# Patient Record
Sex: Female | Born: 1950
Health system: Southern US, Community
[De-identification: ages and names within clinical notes are randomized; demographics above are authoritative.]

## PROBLEM LIST (undated history)

## (undated) DIAGNOSIS — E079 Disorder of thyroid, unspecified: Secondary | ICD-10-CM

## (undated) HISTORY — DX: Disorder of thyroid, unspecified: E07.9

---

## 2016-01-13 ENCOUNTER — Ambulatory Visit (INDEPENDENT_AMBULATORY_CARE_PROVIDER_SITE_OTHER): Payer: PPO | Admitting: Family Medicine

## 2016-01-13 ENCOUNTER — Encounter: Payer: Self-pay | Admitting: Family Medicine

## 2016-01-13 VITALS — BP 151/80 | HR 88 | Temp 98.2°F | Ht 60.75 in | Wt 145.2 lb

## 2016-01-13 DIAGNOSIS — IMO0001 Reserved for inherently not codable concepts without codable children: Secondary | ICD-10-CM

## 2016-01-13 DIAGNOSIS — R030 Elevated blood-pressure reading, without diagnosis of hypertension: Secondary | ICD-10-CM | POA: Diagnosis not present

## 2016-01-13 DIAGNOSIS — Z1159 Encounter for screening for other viral diseases: Secondary | ICD-10-CM | POA: Diagnosis not present

## 2016-01-13 DIAGNOSIS — Z1211 Encounter for screening for malignant neoplasm of colon: Secondary | ICD-10-CM | POA: Diagnosis not present

## 2016-01-13 DIAGNOSIS — E663 Overweight: Secondary | ICD-10-CM | POA: Diagnosis not present

## 2016-01-13 DIAGNOSIS — Z131 Encounter for screening for diabetes mellitus: Secondary | ICD-10-CM

## 2016-01-13 DIAGNOSIS — E049 Nontoxic goiter, unspecified: Secondary | ICD-10-CM | POA: Diagnosis not present

## 2016-01-13 DIAGNOSIS — Z114 Encounter for screening for human immunodeficiency virus [HIV]: Secondary | ICD-10-CM

## 2016-01-13 DIAGNOSIS — R7309 Other abnormal glucose: Secondary | ICD-10-CM

## 2016-01-13 NOTE — Progress Notes (Signed)
BP 151/80 mmHg  Pulse 88  Temp(Src) 98.2 F (36.8 C) (Oral)  Ht 5' 0.75" (1.543 m)  Wt 145 lb 3.2 oz (65.862 kg)  BMI 27.66 kg/m2   Subjective:    Patient ID: Margaret Stevens, female    DOB: 10-01-1950, 65 y.o.   MRN: [id number]  HPI: Margaret Stevens is a 65 y.o. female presenting on 01/13/2016 for New Patient (Initial Visit)   HPI Elevated blood pressure Patient is coming in to establish with [place] as a new patient today. She also wanted to get screening as it has been quite a few years since she's had any labs or checkups. Her blood pressure today is 151/80. Patient says this is higher than it usually is and that she gets white coat syndrome and that it usually runs in the 130s over 70s at home. Patient denies headaches, blurred vision, chest pains, shortness of breath, or weakness.   Enlarged thyroid  Patient was found to have an enlarged thyroid on exam. She did not know that she had this before and has not had a thyroid workup that she knows of. She denies any palpitations or constipation diarrhea or chest pains or hair or nail changes. She does have fatigue.  Relevant past medical, surgical, family and social history reviewed and updated as indicated. Interim medical history since our last visit reviewed. Allergies and medications reviewed and updated.  Review of Systems  Constitutional: Positive for fatigue. Negative for fever and chills.  HENT: Negative for congestion, ear discharge and ear pain.   Eyes: Negative for redness and visual disturbance.  Respiratory: Negative for chest tightness and shortness of breath.   Cardiovascular: Negative for chest pain and leg swelling.  Genitourinary: Negative for dysuria and difficulty urinating.  Musculoskeletal: Negative for back pain and gait problem.  Skin: Negative for rash.  Neurological: Negative for dizziness, light-headedness and headaches.  Psychiatric/Behavioral: Negative for behavioral problems and agitation.  All other  systems reviewed and are negative.   Per HPI unless specifically indicated above  Social History   Social History  . Marital Status: Married    Spouse Name: N/A  . Number of Children: N/A  . Years of Education: N/A   Occupational History  . Not on file.   Social History Main Topics  . Smoking status: Never Smoker   . Smokeless tobacco: Never Used  . Alcohol Use: No  . Drug Use: No  . Sexual Activity: Yes    Birth Control/ Protection: None     Comment: married 43 years   Other Topics Concern  . Not on file   Social History Narrative  . No narrative on file    History reviewed. No pertinent past surgical history.  Family History  Problem Relation Age of Onset  . Hypertension Mother   . Heart disease Father   . Arthritis Father   . COPD Father   . Learning disabilities Father   . Hypertension Father   . Diabetes Brother   . Learning disabilities Brother   . Cancer Maternal Grandmother     skin  . Depression Maternal Grandmother   . Stroke Maternal Grandmother   . Mental illness Maternal Grandmother   . Hypertension Maternal Grandmother   . Hypertension Maternal Grandfather   . Heart disease Paternal Grandmother   . Heart disease Paternal Grandfather       Medication List       This list is accurate as of: 01/13/16  9:47 AM.  Always use your  most recent med list.               ALLERGY RELIEF 10 MG tablet  Generic drug:  loratadine  Take 10 mg by mouth daily.     HAWTHORN BERRIES PO  Take by mouth.     KRILL OIL PLUS PO  Take by mouth.     PROBIOTIC DAILY PO  Take by mouth daily.     Vitamin D3 2000 units Tabs  Take by mouth.     WATER PILLS PO  Take by mouth.           Objective:    BP 151/80 mmHg  Pulse 88  Temp(Src) 98.2 F (36.8 C) (Oral)  Ht 5' 0.75" (1.543 m)  Wt 145 lb 3.2 oz (65.862 kg)  BMI 27.66 kg/m2  Wt Readings from Last 3 Encounters:  01/13/16 145 lb 3.2 oz (65.862 kg)    Physical Exam  Constitutional: She is  oriented to person, place, and time. She appears well-developed and well-nourished. No distress.  Eyes: Conjunctivae and EOM are normal. Pupils are equal, round, and reactive to light.  Neck: Neck supple. Thyromegaly (Diffuse mild thyromegaly) present.  Cardiovascular: Normal rate, regular rhythm, normal heart sounds and intact distal pulses.   No murmur heard. Pulmonary/Chest: Effort normal and breath sounds normal. No respiratory distress. She has no wheezes.  Musculoskeletal: Normal range of motion. She exhibits no edema or tenderness.  Lymphadenopathy:    She has no cervical adenopathy.  Neurological: She is alert and oriented to person, place, and time. Coordination normal.  Skin: Skin is warm and dry. No rash noted. She is not diaphoretic.  Psychiatric: She has a normal mood and affect. Her behavior is normal.  Nursing note and vitals reviewed.     Assessment & Plan:   Problem List Items Addressed This Visit    None    Visit Diagnoses    Overweight (BMI 25.0-29.9)    -  Primary    Relevant Orders    CMP14+EGFR (Completed)    Lipid panel (Completed)    Diabetes mellitus screening        Relevant Orders    CMP14+EGFR (Completed)    Need for hepatitis C screening test        Relevant Orders    Hepatitis C antibody (Completed)    Screening for HIV without presence of risk factors        Relevant Orders    HIV antibody (Completed)    Enlarged thyroid gland        Relevant Orders    TSH (Completed)    Special screening for malignant neoplasms, colon        Relevant Orders    Cologuard    Elevated BP        Other abnormal glucose            Follow up plan: Return in about 4 weeks (around 02/10/2016), or if symptoms worsen or fail to improve, for Hypertension recheck.  Caryl Pina, MD [facility] 01/13/2016, 9:47 AM

## 2016-01-14 LAB — LIPID PANEL
CHOL/HDL RATIO: 4.3 ratio (ref 0.0–4.4)
Cholesterol, Total: 200 mg/dL — ABNORMAL HIGH (ref 100–199)
HDL: 46 mg/dL (ref >39)
LDL Calculated: 115 mg/dL — ABNORMAL HIGH (ref 0–99)
TRIGLYCERIDES: 195 mg/dL — AB (ref 0–149)
VLDL Cholesterol Cal: 39 mg/dL (ref 5–40)

## 2016-01-14 LAB — CMP14+EGFR
A/G RATIO: 1.4 (ref 1.2–2.2)
ALK PHOS: 84 IU/L (ref 39–117)
ALT: 20 IU/L (ref 0–32)
AST: 20 IU/L (ref 0–40)
Albumin: 4.6 g/dL (ref 3.6–4.8)
BUN/Creatinine Ratio: 16 (ref 12–28)
BUN: 13 mg/dL (ref 8–27)
Bilirubin Total: 0.4 mg/dL (ref 0.0–1.2)
CO2: 24 mmol/L (ref 18–29)
Calcium: 9.6 mg/dL (ref 8.7–10.3)
Chloride: 98 mmol/L (ref 96–106)
Creatinine, Ser: 0.8 mg/dL (ref 0.57–1.00)
GFR calc Af Amer: 89 mL/min/{1.73_m2} (ref >59)
GFR, EST NON AFRICAN AMERICAN: 78 mL/min/{1.73_m2} (ref >59)
GLOBULIN, TOTAL: 3.3 g/dL (ref 1.5–4.5)
Glucose: 109 mg/dL — ABNORMAL HIGH (ref 65–99)
POTASSIUM: 4.7 mmol/L (ref 3.5–5.2)
SODIUM: 139 mmol/L (ref 134–144)
Total Protein: 7.9 g/dL (ref 6.0–8.5)

## 2016-01-14 LAB — HEPATITIS C ANTIBODY

## 2016-01-14 LAB — TSH: TSH: 7.36 u[IU]/mL — AB (ref 0.450–4.500)

## 2016-01-14 LAB — HIV ANTIBODY (ROUTINE TESTING W REFLEX): HIV SCREEN 4TH GENERATION: NONREACTIVE

## 2016-01-15 ENCOUNTER — Other Ambulatory Visit: Payer: Self-pay | Admitting: *Deleted

## 2016-01-15 DIAGNOSIS — R7309 Other abnormal glucose: Secondary | ICD-10-CM

## 2016-01-15 LAB — BAYER DCA HB A1C WAIVED: HB A1C: 5.8 % (ref <7.0)

## 2016-01-24 ENCOUNTER — Telehealth: Payer: Self-pay | Admitting: Family Medicine

## 2016-01-24 NOTE — Telephone Encounter (Signed)
Denied.

## 2016-02-10 ENCOUNTER — Ambulatory Visit (INDEPENDENT_AMBULATORY_CARE_PROVIDER_SITE_OTHER): Payer: PPO | Admitting: Family Medicine

## 2016-02-10 ENCOUNTER — Encounter: Payer: Self-pay | Admitting: Family Medicine

## 2016-02-10 VITALS — BP 143/80 | HR 92 | Temp 97.9°F | Ht 60.75 in | Wt 143.0 lb

## 2016-02-10 DIAGNOSIS — IMO0001 Reserved for inherently not codable concepts without codable children: Secondary | ICD-10-CM

## 2016-02-10 DIAGNOSIS — R7989 Other specified abnormal findings of blood chemistry: Secondary | ICD-10-CM

## 2016-02-10 DIAGNOSIS — R946 Abnormal results of thyroid function studies: Secondary | ICD-10-CM

## 2016-02-10 DIAGNOSIS — E049 Nontoxic goiter, unspecified: Secondary | ICD-10-CM

## 2016-02-10 DIAGNOSIS — R030 Elevated blood-pressure reading, without diagnosis of hypertension: Secondary | ICD-10-CM

## 2016-02-10 NOTE — Progress Notes (Signed)
BP (!) 156/89   Pulse 92   Temp 97.9 F (36.6 C) (Oral)   Ht 5' 0.75" (1.543 m)   Wt 143 lb (64.9 kg)   BMI 27.24 kg/m    Subjective:    Patient ID: Margaret Stevens, female    DOB: 1950-07-12, 65 y.o.   MRN: [id number]  HPI: Margaret Stevens is a 64 y.o. female presenting on 02/10/2016 for No chief complaint on file.   HPI Elevated TSH Patient comes in for elevated TSH for recheck today. She denies any symptoms of palpitations or chest pain or constipation or diarrhea. She denies any issues with cold or heat. Patient also has enlarged thyroid and she says she has noted for years but has not had an ultrasound in quite a few years and we will just recheck an ultrasound to see where tap.  Elevated blood pressure Patient comes in for recheck of elevated blood pressure. Her blood pressure is 156/89 here in the office. She also brings in her blood monitor machine that she checks it at home with and it reads accurately with our testing here in the office. She brings in a record of the past month where her blood pressures have been and she has been checking them daily. Her blood pressure has been consistently in the 130s over 80s with 1 or 2 blood pressures over 140 but otherwise well controlled. With her home blood pressure readings being normal, she likely has white coat syndrome and we will just monitor her blood pressures from home. Patient denies headaches, blurred vision, chest pains, shortness of breath, or weakness.   Relevant past medical, surgical, family and social history reviewed and updated as indicated. Interim medical history since our last visit reviewed. Allergies and medications reviewed and updated.  Review of Systems  Constitutional: Negative for chills and fever.  HENT: Negative for congestion, ear discharge and ear pain.   Eyes: Negative for redness and visual disturbance.  Respiratory: Negative for chest tightness and shortness of breath.   Cardiovascular: Negative for  chest pain, palpitations and leg swelling.  Gastrointestinal: Negative for constipation, diarrhea, nausea and vomiting.  Endocrine: Negative for cold intolerance, heat intolerance, polydipsia, polyphagia and polyuria.  Genitourinary: Negative for difficulty urinating and dysuria.  Musculoskeletal: Negative for back pain and gait problem.  Skin: Negative for rash.  Neurological: Negative for light-headedness and headaches.  Psychiatric/Behavioral: Negative for agitation and behavioral problems.  All other systems reviewed and are negative.   Per HPI unless specifically indicated above     Medication List       Accurate as of 02/10/16 10:26 AM. Always use your most recent med list.          ALLERGY RELIEF 10 MG tablet Generic drug:  loratadine Take 10 mg by mouth daily.   HAWTHORN BERRIES PO Take by mouth.   KRILL OIL PLUS PO Take by mouth 2 (two) times daily.   PROBIOTIC DAILY PO Take by mouth daily.   Vitamin D3 2000 units Tabs Take by mouth.   WATER PILLS PO Take by mouth.          Objective:    BP (!) 156/89   Pulse 92   Temp 97.9 F (36.6 C) (Oral)   Ht 5' 0.75" (1.543 m)   Wt 143 lb (64.9 kg)   BMI 27.24 kg/m   Wt Readings from Last 3 Encounters:  02/10/16 143 lb (64.9 kg)  01/13/16 145 lb 3.2 oz (65.9 kg)    Physical  Exam  Constitutional: She is oriented to person, place, and time. She appears well-developed and well-nourished. No distress.  Eyes: Conjunctivae and EOM are normal. Pupils are equal, round, and reactive to light.  Neck: Normal range of motion. Neck supple. Thyromegaly present.  Cardiovascular: Normal rate, regular rhythm, normal heart sounds and intact distal pulses.   No murmur heard. Pulmonary/Chest: Effort normal and breath sounds normal. No respiratory distress. She has no wheezes.  Musculoskeletal: Normal range of motion. She exhibits no edema or tenderness.  Lymphadenopathy:    She has no cervical adenopathy.  Neurological:  She is alert and oriented to person, place, and time. Coordination normal.  Skin: Skin is warm and dry. No rash noted. She is not diaphoretic.  Psychiatric: She has a normal mood and affect. Her behavior is normal.  Nursing note and vitals reviewed.      Assessment & Plan:   Problem List Items Addressed This Visit    None    Visit Diagnoses    Elevated TSH    -  Primary   Relevant Orders   Thyroid Panel With TSH   CBC with Differential/Platelet   Elevated blood pressure       Good home blood pressures, just elevated here in the office, likely white coat syndrome   Enlarged thyroid gland       Relevant Orders   US Soft Tissue Head/Neck       Follow up plan: Return in about 2 months (around 04/11/2016), or if symptoms worsen or fail to improve, for Recheck thyroid.  Counseling provided for all of the vaccine components Orders Placed This Encounter  Procedures  . US Soft Tissue Head/Neck  . Thyroid Panel With TSH  . CBC with Differential/Platelet    Arville CareJoshua Dettinger, MD [facility] 02/10/2016, 10:26 AM

## 2016-02-11 LAB — THYROID PANEL WITH TSH
FREE THYROXINE INDEX: 1.4 (ref 1.2–4.9)
T3 UPTAKE RATIO: 25 % (ref 24–39)
T4, Total: 5.7 ug/dL (ref 4.5–12.0)
TSH: 6.77 u[IU]/mL — ABNORMAL HIGH (ref 0.450–4.500)

## 2016-02-11 LAB — CBC WITH DIFFERENTIAL/PLATELET
BASOS ABS: 0 10*3/uL (ref 0.0–0.2)
Basos: 0 %
EOS (ABSOLUTE): 0.1 10*3/uL (ref 0.0–0.4)
Eos: 1 %
Hematocrit: 39.5 % (ref 34.0–46.6)
Hemoglobin: 13.1 g/dL (ref 11.1–15.9)
IMMATURE GRANS (ABS): 0 10*3/uL (ref 0.0–0.1)
IMMATURE GRANULOCYTES: 0 %
LYMPHS: 29 %
Lymphocytes Absolute: 2.2 10*3/uL (ref 0.7–3.1)
MCH: 27.8 pg (ref 26.6–33.0)
MCHC: 33.2 g/dL (ref 31.5–35.7)
MCV: 84 fL (ref 79–97)
Monocytes Absolute: 0.4 10*3/uL (ref 0.1–0.9)
Monocytes: 6 %
NEUTROS PCT: 64 %
Neutrophils Absolute: 4.9 10*3/uL (ref 1.4–7.0)
PLATELETS: 244 10*3/uL (ref 150–379)
RBC: 4.71 x10E6/uL (ref 3.77–5.28)
RDW: 13.9 % (ref 12.3–15.4)
WBC: 7.7 10*3/uL (ref 3.4–10.8)

## 2016-02-12 MED ORDER — LEVOTHYROXINE SODIUM 50 MCG PO TABS: 50.0000 ug | ORAL_TABLET | Freq: Every day | ORAL | 2 refills | Status: DC

## 2016-02-12 NOTE — Addendum Note (Signed)
Addended by: Lorelee CoverOSTOSKY, JESSICA C on: 02/12/2016 08:51 AM   Modules accepted: Orders

## 2016-02-14 ENCOUNTER — Ambulatory Visit (HOSPITAL_COMMUNITY)
Admission: RE | Admit: 2016-02-14 | Discharge: 2016-02-14 | Disposition: A | Discharge status: Home / Self Care | Payer: PPO | Source: Ambulatory Visit | Attending: Family Medicine | Admitting: Family Medicine

## 2016-02-14 DIAGNOSIS — E049 Nontoxic goiter, unspecified: Secondary | ICD-10-CM

## 2016-02-14 DIAGNOSIS — E042 Nontoxic multinodular goiter: Secondary | ICD-10-CM | POA: Insufficient documentation

## 2016-02-17 ENCOUNTER — Other Ambulatory Visit: Payer: Self-pay

## 2016-02-17 ENCOUNTER — Telehealth: Payer: Self-pay | Admitting: Family Medicine

## 2016-02-17 NOTE — Progress Notes (Signed)
We can repeat ultrasound in 6 months and see if it is grown or not if patient would prefer that but the nodules were concerning and my initial recommendation still stands to try the biopsy. I do understand if she does not want to do that I'm okay with that and we will repeat the ultrasound in 6 months

## 2016-04-23 ENCOUNTER — Telehealth: Payer: Self-pay | Admitting: Family Medicine

## 2016-04-23 NOTE — Telephone Encounter (Signed)
Pt given appt with Dr.dettinger 10/23 at 7:55.

## 2016-04-27 ENCOUNTER — Encounter: Payer: Self-pay | Admitting: Family Medicine

## 2016-04-27 ENCOUNTER — Ambulatory Visit (INDEPENDENT_AMBULATORY_CARE_PROVIDER_SITE_OTHER): Payer: PPO | Admitting: Family Medicine

## 2016-04-27 VITALS — BP 159/91 | HR 94 | Temp 98.2°F | Ht 60.75 in | Wt 147.0 lb

## 2016-04-27 DIAGNOSIS — E039 Hypothyroidism, unspecified: Secondary | ICD-10-CM

## 2016-04-27 DIAGNOSIS — R030 Elevated blood-pressure reading, without diagnosis of hypertension: Secondary | ICD-10-CM | POA: Diagnosis not present

## 2016-04-27 NOTE — Progress Notes (Signed)
BP (!) 177/90   Pulse 94   Temp 98.2 F (36.8 C) (Oral)   Ht 5' 0.75" (1.543 m)   Wt 147 lb (66.7 kg)   BMI 28.00 kg/m    Subjective:    Patient ID: Margaret CoralPenny Stevens, female    DOB: 10/27/1950, 65 y.o.   MRN: [id number]  HPI: Margaret Coralenny Stevens is a 65 y.o. female presenting on 04/27/2016 for Labwork (followup for thyroid)   HPI Hypothyroidism Patient comes in for recheck on her thyroid disease. She is still having some issues with weight gain and intermittent diarrhea. We had just increase her dose on the last visit because her TSH was 6.7. We will recheck it today and see where it is at. She denies any palpitations or chest pain or shortness of breath.  Blood pressure recheck Patient's blood pressure has been slightly elevated in some of the office visits. She has been checking it at home and she says consistently is been 127/87 or 126/86 and never gets over 140 or 90. Patient denies headaches, blurred vision, chest pains, shortness of breath, or weakness.    Relevant past medical, surgical, family and social history reviewed and updated as indicated. Interim medical history since our last visit reviewed. Allergies and medications reviewed and updated.  Review of Systems  Constitutional: Positive for unexpected weight change. Negative for chills and fever.  HENT: Negative for congestion, ear discharge and ear pain.   Eyes: Negative for redness and visual disturbance.  Respiratory: Negative for chest tightness and shortness of breath.   Cardiovascular: Negative for chest pain and leg swelling.  Gastrointestinal: Positive for diarrhea. Negative for abdominal pain, blood in stool, constipation, nausea and vomiting.  Genitourinary: Negative for difficulty urinating and dysuria.  Musculoskeletal: Negative for back pain and gait problem.  Skin: Negative for rash.  Neurological: Negative for dizziness, light-headedness and headaches.  Psychiatric/Behavioral: Negative for agitation and  behavioral problems.  All other systems reviewed and are negative.   Per HPI unless specifically indicated above     Objective:    BP (!) 177/90   Pulse 94   Temp 98.2 F (36.8 C) (Oral)   Ht 5' 0.75" (1.543 m)   Wt 147 lb (66.7 kg)   BMI 28.00 kg/m   Wt Readings from Last 3 Encounters:  04/27/16 147 lb (66.7 kg)  02/10/16 143 lb (64.9 kg)  01/13/16 145 lb 3.2 oz (65.9 kg)    Physical Exam  Constitutional: She is oriented to person, place, and time. She appears well-developed and well-nourished. No distress.  Eyes: Conjunctivae are normal.  Neck: Neck supple. No thyromegaly present.  Cardiovascular: Normal rate, regular rhythm, normal heart sounds and intact distal pulses.   No murmur heard. Pulmonary/Chest: Effort normal and breath sounds normal. No respiratory distress. She has no wheezes.  Musculoskeletal: Normal range of motion. She exhibits no edema or tenderness.  Lymphadenopathy:    She has no cervical adenopathy.  Neurological: She is alert and oriented to person, place, and time. Coordination normal.  Skin: Skin is warm and dry. No rash noted. She is not diaphoretic.  Psychiatric: She has a normal mood and affect. Her behavior is normal.  Nursing note and vitals reviewed.     Assessment & Plan:   Problem List Items Addressed This Visit      Endocrine   Hypothyroidism - Primary   Relevant Orders   TSH    Other Visit Diagnoses    White coat syndrome without diagnosis of hypertension  Continue to monitor, has blood pressures of 127/87 and 126/86 consistently at home       Follow up plan: Return in about 3 months (around 07/28/2016), or if symptoms worsen or fail to improve, for Thyroid recheck.  Counseling provided for all of the vaccine components Orders Placed This Encounter  Procedures  . TSH    Arville Care, MD [facility] 04/27/2016, 8:36 AM

## 2016-04-28 LAB — TSH: TSH: 1.44 u[IU]/mL (ref 0.450–4.500)

## 2016-05-12 ENCOUNTER — Other Ambulatory Visit: Payer: Self-pay | Admitting: *Deleted

## 2016-05-12 MED ORDER — LEVOTHYROXINE SODIUM 50 MCG PO TABS: 50.0000 ug | ORAL_TABLET | Freq: Every day | ORAL | 2 refills | Status: DC

## 2016-05-12 NOTE — Progress Notes (Signed)
Pt came in to request refill of Levothyroxine Pt had recent labs Refill sent in per pt request

## 2016-08-05 ENCOUNTER — Telehealth: Payer: Self-pay | Admitting: Family Medicine

## 2016-08-05 MED ORDER — LEVOTHYROXINE SODIUM 50 MCG PO TABS: 50.0000 ug | ORAL_TABLET | Freq: Every day | ORAL | 1 refills | Status: DC

## 2016-08-05 NOTE — Telephone Encounter (Signed)
Refill sent to pharmacy per protocol. Last visit & TSH was 04/27/2016

## 2017-03-23 ENCOUNTER — Encounter: Payer: Self-pay | Admitting: Family Medicine

## 2017-03-23 ENCOUNTER — Ambulatory Visit (INDEPENDENT_AMBULATORY_CARE_PROVIDER_SITE_OTHER): Payer: PPO | Admitting: Family Medicine

## 2017-03-23 VITALS — BP 180/102 | HR 86 | Temp 98.5°F | Ht 60.75 in | Wt 147.0 lb

## 2017-03-23 DIAGNOSIS — E039 Hypothyroidism, unspecified: Secondary | ICD-10-CM

## 2017-03-23 DIAGNOSIS — Z13220 Encounter for screening for lipoid disorders: Secondary | ICD-10-CM

## 2017-03-23 DIAGNOSIS — Z131 Encounter for screening for diabetes mellitus: Secondary | ICD-10-CM | POA: Diagnosis not present

## 2017-03-23 MED ORDER — LEVOTHYROXINE SODIUM 50 MCG PO TABS: 50.0000 ug | ORAL_TABLET | Freq: Every day | ORAL | 1 refills | Status: DC

## 2017-03-23 NOTE — Progress Notes (Signed)
 BP (!) 179/92   Pulse 86   Temp 98.5 F (36.9 C) (Oral)   Ht 5' 0.75" (1.543 m)   Wt 147 lb (66.7 kg)   BMI 28.00 kg/m    Subjective:    Patient ID: Margaret Stevens, female    DOB: 09/16/1950, 66 y.o.   MRN: [id number]  HPI: Margaret Stevens is a 66 y.o. female presenting on 03/23/2017 for Hypothyroidism (follow up)   HPI Hypothyroidism recheck Patient is coming in for thyroid recheck today as well. They deny any issues with hair changes or heat or cold problems or diarrhea or constipation. They deny any chest pain or palpitations. They are currently on levothyroxine 50micrograms   Relevant past medical, surgical, family and social history reviewed and updated as indicated. Interim medical history since our last visit reviewed. Allergies and medications reviewed and updated.  Review of Systems  Constitutional: Negative for chills and fever.  HENT: Negative for congestion, ear discharge and ear pain.   Eyes: Negative for redness and visual disturbance.  Respiratory: Negative for chest tightness and shortness of breath.   Cardiovascular: Negative for chest pain and leg swelling.  Endocrine: Negative for cold intolerance and heat intolerance.  Genitourinary: Negative for difficulty urinating and dysuria.  Musculoskeletal: Negative for back pain and gait problem.  Skin: Negative for rash.  Neurological: Negative for dizziness, light-headedness and headaches.  Psychiatric/Behavioral: Negative for agitation and behavioral problems.  All other systems reviewed and are negative.   Per HPI unless specifically indicated above     Objective:    BP (!) 179/92   Pulse 86   Temp 98.5 F (36.9 C) (Oral)   Ht 5' 0.75" (1.543 m)   Wt 147 lb (66.7 kg)   BMI 28.00 kg/m   Wt Readings from Last 3 Encounters:  03/23/17 147 lb (66.7 kg)  04/27/16 147 lb (66.7 kg)  02/10/16 143 lb (64.9 kg)    Physical Exam  Constitutional: She is oriented to person, place, and time. She appears  well-developed and well-nourished. No distress.  Eyes: Conjunctivae are normal.  Neck: Neck supple. No thyromegaly present.  Cardiovascular: Normal rate, regular rhythm, normal heart sounds and intact distal pulses.   No murmur heard. Pulmonary/Chest: Effort normal and breath sounds normal. No respiratory distress. She has no wheezes.  Musculoskeletal: Normal range of motion. She exhibits no edema or tenderness.  Lymphadenopathy:    She has no cervical adenopathy.  Neurological: She is alert and oriented to person, place, and time. Coordination normal.  Skin: Skin is warm and dry. No rash noted. She is not diaphoretic.  Psychiatric: She has a normal mood and affect. Her behavior is normal.  Nursing note and vitals reviewed.     Assessment & Plan:   Problem List Items Addressed This Visit      Endocrine   Hypothyroidism - Primary   Relevant Medications   levothyroxine (SYNTHROID) 50 MCG tablet   Other Relevant Orders   TSH (Completed)    Other Visit Diagnoses    Diabetes mellitus screening       Relevant Orders   CMP14+EGFR (Completed)   Bayer DCA Hb A1c Waived (Completed)   Lipid screening       Relevant Orders   Lipid panel (Completed)       Follow up plan: Return in about 6 months (around 09/20/2017), or if symptoms worsen or fail to improve, for Recheck thyroid.  Counseling provided for all of the vaccine components Orders Placed This Encounter    Procedures  . CMP14+EGFR  . Lipid panel  . TSH    Joshua Dettinger, MD [facility] 03/23/2017, 9:07 AM     

## 2017-03-24 LAB — LIPID PANEL
CHOL/HDL RATIO: 4.1 ratio (ref 0.0–4.4)
Cholesterol, Total: 179 mg/dL (ref 100–199)
HDL: 44 mg/dL (ref >39)
LDL Calculated: 97 mg/dL (ref 0–99)
Triglycerides: 192 mg/dL — ABNORMAL HIGH (ref 0–149)
VLDL CHOLESTEROL CAL: 38 mg/dL (ref 5–40)

## 2017-03-24 LAB — CMP14+EGFR
ALBUMIN: 4.6 g/dL (ref 3.6–4.8)
ALT: 27 IU/L (ref 0–32)
AST: 26 IU/L (ref 0–40)
Albumin/Globulin Ratio: 1.5 (ref 1.2–2.2)
Alkaline Phosphatase: 88 IU/L (ref 39–117)
BUN / CREAT RATIO: 13 (ref 12–28)
BUN: 11 mg/dL (ref 8–27)
Bilirubin Total: 0.4 mg/dL (ref 0.0–1.2)
CALCIUM: 9.5 mg/dL (ref 8.7–10.3)
CHLORIDE: 102 mmol/L (ref 96–106)
CO2: 22 mmol/L (ref 20–29)
Creatinine, Ser: 0.83 mg/dL (ref 0.57–1.00)
GFR, EST AFRICAN AMERICAN: 85 mL/min/{1.73_m2} (ref >59)
GFR, EST NON AFRICAN AMERICAN: 74 mL/min/{1.73_m2} (ref >59)
GLUCOSE: 109 mg/dL — AB (ref 65–99)
Globulin, Total: 3 g/dL (ref 1.5–4.5)
Potassium: 4.6 mmol/L (ref 3.5–5.2)
Sodium: 140 mmol/L (ref 134–144)
TOTAL PROTEIN: 7.6 g/dL (ref 6.0–8.5)

## 2017-03-24 LAB — TSH: TSH: 3.73 u[IU]/mL (ref 0.450–4.500)

## 2017-03-25 LAB — BAYER DCA HB A1C WAIVED: HB A1C (BAYER DCA - WAIVED): 5.9 % (ref <7.0)

## 2017-11-02 ENCOUNTER — Telehealth: Payer: Self-pay | Admitting: Family Medicine

## 2017-11-02 MED ORDER — LEVOTHYROXINE SODIUM 50 MCG PO TABS: 50.0000 ug | ORAL_TABLET | Freq: Every day | ORAL | 0 refills | Status: DC

## 2017-11-02 NOTE — Telephone Encounter (Signed)
What is the name of the medication? levothyroxine (SYNTHROID) 50 MCG tablet  Have you contacted your pharmacy to request a refill? no  Which pharmacy would you like this sent to? [facility]. She wants a refill before she has to come in. She does not want to come in until FLU season is over.   Patient notified that their request is being sent to the clinical staff for review and that they should receive a call once it is complete. If they do not receive a call within 24 hours they can check with their pharmacy or our office.

## 2017-11-23 ENCOUNTER — Telehealth: Payer: Self-pay | Admitting: Family Medicine

## 2018-02-11 ENCOUNTER — Ambulatory Visit (INDEPENDENT_AMBULATORY_CARE_PROVIDER_SITE_OTHER): Payer: PPO | Admitting: Family Medicine

## 2018-02-11 ENCOUNTER — Encounter: Payer: Self-pay | Admitting: Family Medicine

## 2018-02-11 VITALS — BP 183/84 | HR 89 | Temp 97.8°F | Ht 60.75 in | Wt 142.8 lb

## 2018-02-11 DIAGNOSIS — Z13220 Encounter for screening for lipoid disorders: Secondary | ICD-10-CM | POA: Diagnosis not present

## 2018-02-11 DIAGNOSIS — R030 Elevated blood-pressure reading, without diagnosis of hypertension: Secondary | ICD-10-CM

## 2018-02-11 DIAGNOSIS — E039 Hypothyroidism, unspecified: Secondary | ICD-10-CM | POA: Diagnosis not present

## 2018-02-11 MED ORDER — LEVOTHYROXINE SODIUM 50 MCG PO TABS: 50.0000 ug | ORAL_TABLET | Freq: Every day | ORAL | 2 refills | Status: DC

## 2018-02-11 NOTE — Progress Notes (Signed)
BP (!) 183/84   Pulse 89   Temp 97.8 F (36.6 C) (Oral)   Ht 5' 0.75" (1.543 m)   Wt 142 lb 12.8 oz (64.8 kg)   BMI 27.20 kg/m    Subjective:    Patient ID: Margaret Stevens, female    DOB: 01-14-1951, 67 y.o.   MRN: [id number]  HPI: Margaret Stevens is a 67 y.o. female presenting on 02/11/2018 for Hypothyroidism   HPI Hypothyroidism recheck Patient is coming in for thyroid recheck today as well. They deny any issues with hair changes or heat or cold problems or diarrhea or constipation. They deny any chest pain or palpitations. They are currently on levothyroxine 70mcrograms   Whitecoat hypertension Patient continues to have whitecoat hypertension and her blood pressure today in the office is 182/80, we had her check her machine against our machine and it came back pretty accurate and she consistently gets numbers between 120 and 145/86-91 at home. Patient denies any chest pain, shortness of breath, headaches or vision issues, abdominal complaints, diarrhea, nausea, vomiting, or joint issues.   Relevant past medical, surgical, family and social history reviewed and updated as indicated. Interim medical history since our last visit reviewed. Allergies and medications reviewed and updated.  Review of Systems  Constitutional: Negative for chills and fever.  Eyes: Negative for visual disturbance.  Respiratory: Negative for chest tightness and shortness of breath.   Cardiovascular: Negative for chest pain and leg swelling.  Musculoskeletal: Negative for back pain and gait problem.  Skin: Negative for rash.  Neurological: Negative for dizziness, weakness, light-headedness, numbness and headaches.  Psychiatric/Behavioral: Negative for agitation and behavioral problems.  All other systems reviewed and are negative.   Per HPI unless specifically indicated above   Allergies as of 02/11/2018   No Known Allergies     Medication List        Accurate as of 02/11/18  8:58 AM. Always use  your most recent med list.          cetirizine 10 MG tablet Commonly known as:  ZYRTEC Take 10 mg by mouth daily.   HAWTHORN BERRIES PO Take by mouth.   KRILL OIL PLUS PO Take by mouth 2 (two) times daily.   levothyroxine 50 MCG tablet Commonly known as:  SYNTHROID, LEVOTHROID Take 1 tablet (50 mcg total) by mouth daily before breakfast.   PROBIOTIC DAILY PO Take by mouth daily.   vitamin C 100 MG tablet Take 100 mg by mouth daily.   Vitamin D3 2000 units Tabs Take by mouth.   WATER PILLS PO Take by mouth.          Objective:    BP (!) 183/84   Pulse 89   Temp 97.8 F (36.6 C) (Oral)   Ht 5' 0.75" (1.543 m)   Wt 142 lb 12.8 oz (64.8 kg)   BMI 27.20 kg/m   Wt Readings from Last 3 Encounters:  02/11/18 142 lb 12.8 oz (64.8 kg)  03/23/17 147 lb (66.7 kg)  04/27/16 147 lb (66.7 kg)    Physical Exam  Constitutional: She is oriented to person, place, and time. She appears well-developed and well-nourished. No distress.  Eyes: Conjunctivae are normal.  Neck: Neck supple. No thyromegaly present.  Cardiovascular: Normal rate, regular rhythm, normal heart sounds and intact distal pulses.  No murmur heard. Pulmonary/Chest: Effort normal and breath sounds normal. No respiratory distress. She has no wheezes.  Musculoskeletal: Normal range of motion. She exhibits no edema.  Lymphadenopathy:  She has no cervical adenopathy.  Neurological: She is alert and oriented to person, place, and time. Coordination normal.  Skin: Skin is warm and dry. No rash noted. She is not diaphoretic.  Psychiatric: She has a normal mood and affect. Her behavior is normal.  Nursing note and vitals reviewed.       Assessment & Plan:   Problem List Items Addressed This Visit      Endocrine   Hypothyroidism - Primary   Relevant Medications   levothyroxine (SYNTHROID) 50 MCG tablet   Other Relevant Orders   CBC with Differential/Platelet   TSH    Other Visit Diagnoses    White  coat syndrome without diagnosis of hypertension       Relevant Orders   CMP14+EGFR   Lipid screening       Relevant Orders   Lipid panel       Follow up plan: Return in about 6 months (around 08/14/2018), or if symptoms worsen or fail to improve, for Thyroid recheck.  Counseling provided for all of the vaccine components Orders Placed This Encounter  Procedures  . CBC with Differential/Platelet  . CMP14+EGFR  . Lipid panel  . TSH    Caryl Pina, MD [facility] 02/11/2018, 8:58 AM

## 2018-02-12 LAB — LIPID PANEL
CHOLESTEROL TOTAL: 198 mg/dL (ref 100–199)
Chol/HDL Ratio: 4.5 ratio — ABNORMAL HIGH (ref 0.0–4.4)
HDL: 44 mg/dL (ref >39)
LDL Calculated: 125 mg/dL — ABNORMAL HIGH (ref 0–99)
TRIGLYCERIDES: 143 mg/dL (ref 0–149)
VLDL Cholesterol Cal: 29 mg/dL (ref 5–40)

## 2018-02-12 LAB — CMP14+EGFR
ALBUMIN: 4.4 g/dL (ref 3.6–4.8)
ALK PHOS: 86 IU/L (ref 39–117)
ALT: 17 IU/L (ref 0–32)
AST: 20 IU/L (ref 0–40)
Albumin/Globulin Ratio: 1.4 (ref 1.2–2.2)
BILIRUBIN TOTAL: 0.5 mg/dL (ref 0.0–1.2)
BUN / CREAT RATIO: 15 (ref 12–28)
BUN: 13 mg/dL (ref 8–27)
CHLORIDE: 102 mmol/L (ref 96–106)
CO2: 23 mmol/L (ref 20–29)
Calcium: 9.5 mg/dL (ref 8.7–10.3)
Creatinine, Ser: 0.86 mg/dL (ref 0.57–1.00)
GFR calc Af Amer: 81 mL/min/{1.73_m2} (ref >59)
GFR calc non Af Amer: 70 mL/min/{1.73_m2} (ref >59)
Globulin, Total: 3.2 g/dL (ref 1.5–4.5)
Glucose: 108 mg/dL — ABNORMAL HIGH (ref 65–99)
Potassium: 4.3 mmol/L (ref 3.5–5.2)
Sodium: 141 mmol/L (ref 134–144)
Total Protein: 7.6 g/dL (ref 6.0–8.5)

## 2018-02-12 LAB — CBC WITH DIFFERENTIAL/PLATELET
BASOS ABS: 0 10*3/uL (ref 0.0–0.2)
Basos: 0 %
EOS (ABSOLUTE): 0.2 10*3/uL (ref 0.0–0.4)
Eos: 3 %
HEMOGLOBIN: 13.8 g/dL (ref 11.1–15.9)
Hematocrit: 40.7 % (ref 34.0–46.6)
Immature Grans (Abs): 0 10*3/uL (ref 0.0–0.1)
Immature Granulocytes: 0 %
LYMPHS ABS: 2.6 10*3/uL (ref 0.7–3.1)
Lymphs: 37 %
MCH: 29.2 pg (ref 26.6–33.0)
MCHC: 33.9 g/dL (ref 31.5–35.7)
MCV: 86 fL (ref 79–97)
MONOCYTES: 5 %
MONOS ABS: 0.4 10*3/uL (ref 0.1–0.9)
NEUTROS ABS: 3.7 10*3/uL (ref 1.4–7.0)
Neutrophils: 55 %
Platelets: 233 10*3/uL (ref 150–450)
RBC: 4.72 x10E6/uL (ref 3.77–5.28)
RDW: 14.4 % (ref 12.3–15.4)
WBC: 6.9 10*3/uL (ref 3.4–10.8)

## 2018-02-12 LAB — TSH: TSH: 5.34 u[IU]/mL — ABNORMAL HIGH (ref 0.450–4.500)

## 2018-02-14 ENCOUNTER — Telehealth: Payer: Self-pay | Admitting: Family Medicine

## 2018-02-14 ENCOUNTER — Other Ambulatory Visit: Payer: Self-pay | Admitting: *Deleted

## 2018-02-14 MED ORDER — LEVOTHYROXINE SODIUM 75 MCG PO TABS: 75.0000 ug | ORAL_TABLET | Freq: Every day | ORAL | 3 refills | Status: DC

## 2018-02-14 NOTE — Progress Notes (Signed)
Pt notified of lab results [facility] Lou[facility]

## 2018-02-14 NOTE — Telephone Encounter (Signed)
Refer to lab note °

## 2018-02-16 ENCOUNTER — Telehealth: Payer: Self-pay | Admitting: Family Medicine

## 2018-08-04 ENCOUNTER — Telehealth: Payer: Self-pay | Admitting: Family Medicine

## 2019-05-25 ENCOUNTER — Ambulatory Visit (INDEPENDENT_AMBULATORY_CARE_PROVIDER_SITE_OTHER): Payer: PPO | Admitting: Family Medicine

## 2019-05-25 ENCOUNTER — Encounter: Payer: Self-pay | Admitting: Family Medicine

## 2019-05-25 DIAGNOSIS — Z131 Encounter for screening for diabetes mellitus: Secondary | ICD-10-CM

## 2019-05-25 DIAGNOSIS — E039 Hypothyroidism, unspecified: Secondary | ICD-10-CM | POA: Diagnosis not present

## 2019-05-25 DIAGNOSIS — Z13220 Encounter for screening for lipoid disorders: Secondary | ICD-10-CM | POA: Diagnosis not present

## 2019-05-25 MED ORDER — LEVOTHYROXINE SODIUM 75 MCG PO TABS: 75.0000 ug | ORAL_TABLET | Freq: Every day | ORAL | 3 refills | Status: DC

## 2019-05-25 NOTE — Progress Notes (Signed)
Virtual Visit via telephone Note  I connected with Margaret Stevens on 05/25/19 at 1005 by telephone and verified that I am speaking with the correct person using two identifiers. Margaret Stevens is currently located at home and no other people are currently with her during visit. The provider, Fransisca Kaufmann Lashaun Poch, MD is located in their office at time of visit.  Call ended at 1015  I discussed the limitations, risks, security and privacy concerns of performing an evaluation and management service by telephone and the availability of in person appointments. I also discussed with the patient that there may be a patient responsible charge related to this service. The patient expressed understanding and agreed to proceed.  131/88 History and Present Illness: Hypothyroidism recheck Patient is coming in for thyroid recheck today as well. They deny any issues with hair changes or heat or cold problems or diarrhea or constipation. They deny any chest pain or palpitations. They are currently on levothyroxine 50mcrograms   No diagnosis found.  Outpatient Encounter Medications as of 05/25/2019  Medication Sig  . Ascorbic Acid (VITAMIN C) 100 MG tablet Take 100 mg by mouth daily.  . BucAlfAspKGlucCouchParsUvaUrJu (WATER PILLS PO) Take by mouth.  . cetirizine (ZYRTEC) 10 MG tablet Take 10 mg by mouth daily.  . Cholecalciferol (VITAMIN D3) 2000 units TABS Take by mouth.  . Fish Oil-Krill Oil (KRILL OIL PLUS PO) Take by mouth 2 (two) times daily.   .Marland KitchenHAWTHORN BERRIES PO Take by mouth.  . levothyroxine (SYNTHROID, LEVOTHROID) 75 MCG tablet Take 1 tablet (75 mcg total) by mouth daily.  . Probiotic Product (PROBIOTIC DAILY PO) Take by mouth daily.   No facility-administered encounter medications on file as of 05/25/2019.     Review of Systems  Constitutional: Negative for chills and fever.  Eyes: Negative for visual disturbance.  Respiratory: Negative for chest tightness and shortness of breath.    Cardiovascular: Negative for chest pain and leg swelling.  Musculoskeletal: Negative for back pain and gait problem.  Skin: Negative for rash.  Neurological: Negative for light-headedness and headaches.  Psychiatric/Behavioral: Negative for agitation, behavioral problems, self-injury, sleep disturbance and suicidal ideas.  All other systems reviewed and are negative.   Observations/Objective: Patient sounds comfortable and in no acute distress  Assessment and Plan: Problem List Items Addressed This Visit      Endocrine   Hypothyroidism - Primary   Relevant Medications   levothyroxine (SYNTHROID) 75 MCG tablet   Other Relevant Orders   CBC with Differential/Platelet   TSH    Other Visit Diagnoses    Diabetes mellitus screening       Relevant Orders   CBC with Differential/Platelet   CMP14+EGFR   Lipid screening       Relevant Orders   CBC with Differential/Platelet   Lipid panel       Follow Up Instructions: Follow up in 6 month for thyroid    I discussed the assessment and treatment plan with the patient. The patient was provided an opportunity to ask questions and all were answered. The patient agreed with the plan and demonstrated an understanding of the instructions.   The patient was advised to call back or seek an in-person evaluation if the symptoms worsen or if the condition fails to improve as anticipated.  The above assessment and management plan was discussed with the patient. The patient verbalized understanding of and has agreed to the management plan. Patient is aware to call the clinic if symptoms persist or worsen.  Patient is aware when to return to the clinic for a follow-up visit. Patient educated on when it is appropriate to go to the emergency department.    I provided 10 minutes of non-face-to-face time during this encounter.    Worthy Rancher, MD

## 2019-05-29 ENCOUNTER — Other Ambulatory Visit: Payer: Self-pay

## 2019-05-29 ENCOUNTER — Other Ambulatory Visit: Payer: PPO

## 2019-05-29 DIAGNOSIS — Z13220 Encounter for screening for lipoid disorders: Secondary | ICD-10-CM | POA: Diagnosis not present

## 2019-05-29 DIAGNOSIS — Z131 Encounter for screening for diabetes mellitus: Secondary | ICD-10-CM

## 2019-05-29 DIAGNOSIS — E039 Hypothyroidism, unspecified: Secondary | ICD-10-CM | POA: Diagnosis not present

## 2019-05-30 LAB — CMP14+EGFR
ALT: 28 IU/L (ref 0–32)
AST: 27 IU/L (ref 0–40)
Albumin/Globulin Ratio: 1.5 (ref 1.2–2.2)
Albumin: 4.4 g/dL (ref 3.8–4.8)
Alkaline Phosphatase: 106 IU/L (ref 39–117)
BUN/Creatinine Ratio: 12 (ref 12–28)
BUN: 10 mg/dL (ref 8–27)
Bilirubin Total: 0.5 mg/dL (ref 0.0–1.2)
CO2: 25 mmol/L (ref 20–29)
Calcium: 9.2 mg/dL (ref 8.7–10.3)
Chloride: 102 mmol/L (ref 96–106)
Creatinine, Ser: 0.81 mg/dL (ref 0.57–1.00)
GFR calc Af Amer: 86 mL/min/{1.73_m2} (ref >59)
GFR calc non Af Amer: 75 mL/min/{1.73_m2} (ref >59)
Globulin, Total: 2.9 g/dL (ref 1.5–4.5)
Glucose: 115 mg/dL — ABNORMAL HIGH (ref 65–99)
Potassium: 4.4 mmol/L (ref 3.5–5.2)
Sodium: 141 mmol/L (ref 134–144)
Total Protein: 7.3 g/dL (ref 6.0–8.5)

## 2019-05-30 LAB — CBC WITH DIFFERENTIAL/PLATELET
Basophils Absolute: 0 10*3/uL (ref 0.0–0.2)
Basos: 0 %
EOS (ABSOLUTE): 0.2 10*3/uL (ref 0.0–0.4)
Eos: 3 %
Hematocrit: 40.5 % (ref 34.0–46.6)
Hemoglobin: 13.2 g/dL (ref 11.1–15.9)
Immature Grans (Abs): 0 10*3/uL (ref 0.0–0.1)
Immature Granulocytes: 0 %
Lymphocytes Absolute: 2.6 10*3/uL (ref 0.7–3.1)
Lymphs: 35 %
MCH: 29.3 pg (ref 26.6–33.0)
MCHC: 32.6 g/dL (ref 31.5–35.7)
MCV: 90 fL (ref 79–97)
Monocytes Absolute: 0.5 10*3/uL (ref 0.1–0.9)
Monocytes: 7 %
Neutrophils Absolute: 4.1 10*3/uL (ref 1.4–7.0)
Neutrophils: 55 %
Platelets: 230 10*3/uL (ref 150–450)
RBC: 4.51 x10E6/uL (ref 3.77–5.28)
RDW: 12.6 % (ref 11.7–15.4)
WBC: 7.5 10*3/uL (ref 3.4–10.8)

## 2019-05-30 LAB — LIPID PANEL
Chol/HDL Ratio: 4.2 ratio (ref 0.0–4.4)
Cholesterol, Total: 175 mg/dL (ref 100–199)
HDL: 42 mg/dL (ref >39)
LDL Chol Calc (NIH): 110 mg/dL — ABNORMAL HIGH (ref 0–99)
Triglycerides: 127 mg/dL (ref 0–149)
VLDL Cholesterol Cal: 23 mg/dL (ref 5–40)

## 2019-05-30 LAB — TSH: TSH: 0.139 u[IU]/mL — ABNORMAL LOW (ref 0.450–4.500)

## 2019-05-31 ENCOUNTER — Other Ambulatory Visit: Payer: Self-pay | Admitting: *Deleted

## 2019-05-31 DIAGNOSIS — E039 Hypothyroidism, unspecified: Secondary | ICD-10-CM

## 2019-05-31 MED ORDER — LEVOTHYROXINE SODIUM 50 MCG PO TABS: 75.0000 ug | ORAL_TABLET | Freq: Every day | ORAL | 0 refills | Status: DC

## 2019-06-05 ENCOUNTER — Other Ambulatory Visit: Payer: Self-pay | Admitting: *Deleted

## 2019-06-05 ENCOUNTER — Telehealth: Payer: Self-pay | Admitting: Family Medicine

## 2019-06-05 DIAGNOSIS — E039 Hypothyroidism, unspecified: Secondary | ICD-10-CM

## 2019-06-05 MED ORDER — LEVOTHYROXINE SODIUM 50 MCG PO TABS: 50.0000 ug | ORAL_TABLET | Freq: Every day | ORAL | 3 refills | Status: DC

## 2019-06-05 NOTE — Telephone Encounter (Signed)
Patient states that she picked up levothyroxine rx at pharmacy on Saturday and the directions read take 1 1/2 but she was instructed to reduce dose to 50. I advised that the directions were probably incorrect and should read take 1 tab but she wanted me to check with you to be sure. Please advise

## 2019-06-05 NOTE — Telephone Encounter (Signed)
Patient notified and verbalized understanding. 

## 2019-06-05 NOTE — Telephone Encounter (Signed)
Sorry yes that was an error on my part, I changed it to 50 in 1 area but not of another, she is just supposed to take 50 mcg 1 tablet a day. Caryl Pina, MD [facility] 06/05/2019, 12:55 PM

## 2019-09-11 ENCOUNTER — Other Ambulatory Visit: Payer: Self-pay

## 2019-09-11 ENCOUNTER — Other Ambulatory Visit: Payer: PPO

## 2019-09-11 DIAGNOSIS — E039 Hypothyroidism, unspecified: Secondary | ICD-10-CM | POA: Diagnosis not present

## 2019-09-12 LAB — TSH: TSH: 5.82 u[IU]/mL — ABNORMAL HIGH (ref 0.450–4.500)

## 2019-09-18 MED ORDER — LEVOTHYROXINE SODIUM 75 MCG PO TABS: 75.0000 ug | ORAL_TABLET | Freq: Every day | ORAL | 5 refills | Status: DC

## 2020-01-30 ENCOUNTER — Other Ambulatory Visit: Payer: Self-pay

## 2020-09-27 ENCOUNTER — Other Ambulatory Visit: Payer: Self-pay

## 2020-09-27 ENCOUNTER — Ambulatory Visit (INDEPENDENT_AMBULATORY_CARE_PROVIDER_SITE_OTHER): Payer: PPO | Admitting: Family Medicine

## 2020-09-27 ENCOUNTER — Encounter: Payer: Self-pay | Admitting: Family Medicine

## 2020-09-27 VITALS — BP 188/81 | HR 86 | Ht 60.0 in | Wt 144.2 lb

## 2020-09-27 DIAGNOSIS — E785 Hyperlipidemia, unspecified: Secondary | ICD-10-CM

## 2020-09-27 DIAGNOSIS — I10 Essential (primary) hypertension: Secondary | ICD-10-CM | POA: Diagnosis not present

## 2020-09-27 DIAGNOSIS — E039 Hypothyroidism, unspecified: Secondary | ICD-10-CM

## 2020-09-27 MED ORDER — HYDROCHLOROTHIAZIDE 25 MG PO TABS
25.0000 mg | ORAL_TABLET | Freq: Every day | ORAL | 3 refills | Status: DC
Start: 2020-09-27 — End: 2021-09-25

## 2020-09-27 NOTE — Progress Notes (Signed)
BP (!) 188/81   Pulse 86   Ht 5' (1.524 m)   Wt 144 lb 4 oz (65.4 kg)   SpO2 98%   BMI 28.17 kg/m    Subjective:   Patient ID: Margaret Stevens, female    DOB: 08/27/1950, 70 y.o.   MRN: [id number]  HPI: Margaret Stevens is a 70 y.o. female presenting on 09/27/2020 for Medical Management of Chronic Issues and Hypothyroidism   HPI Hypertension Patient is currently on no medication but is getting home readings in the 140s over 90s and upper 90s, and their blood pressure today is 188/81. Patient denies any lightheadedness or dizziness. Patient denies headaches, blurred vision, chest pains, shortness of breath, or weakness. Denies any side effects from medication and is content with current medication.   Hyperlipidemia Patient is coming in for recheck of his hyperlipidemia. The patient is currently taking Krill oil. They deny any issues with myalgias or history of liver damage from it. They deny any focal numbness or weakness or chest pain.   Hypothyroidism recheck Patient is coming in for thyroid recheck today as well. They deny any issues with hair changes or heat or cold problems or diarrhea or constipation. They deny any chest pain or palpitations. They are currently on levothyroxine 75 micrograms   Relevant past medical, surgical, family and social history reviewed and updated as indicated. Interim medical history since our last visit reviewed. Allergies and medications reviewed and updated.  Review of Systems  Constitutional: Negative for chills and fever.  Eyes: Negative for visual disturbance.  Respiratory: Negative for chest tightness and shortness of breath.   Cardiovascular: Negative for chest pain and leg swelling.  Genitourinary: Negative for difficulty urinating and dysuria.  Musculoskeletal: Negative for back pain and gait problem.  Skin: Negative for rash.  Neurological: Negative for dizziness, light-headedness and headaches.  Psychiatric/Behavioral: Negative for  agitation and behavioral problems.  All other systems reviewed and are negative.   Per HPI unless specifically indicated above   Allergies as of 09/27/2020   No Known Allergies     Medication List       Accurate as of September 27, 2020 11:43 AM. If you have any questions, ask your nurse or doctor.        cetirizine 10 MG tablet Commonly known as: ZYRTEC Take 10 mg by mouth daily.   Cinnamon 500 MG capsule Take 500 mg by mouth daily.   HAWTHORN BERRIES PO Take by mouth.   hydrochlorothiazide 25 MG tablet Commonly known as: HYDRODIURIL Take 1 tablet (25 mg total) by mouth daily. Started by: Fransisca Kaufmann Dettinger, MD   KRILL OIL PLUS PO Take by mouth 2 (two) times daily.   levothyroxine 75 MCG tablet Commonly known as: SYNTHROID Take 1 tablet (75 mcg total) by mouth daily. What changed: Another medication with the same name was removed. Continue taking this medication, and follow the directions you see here. Changed by: Fransisca Kaufmann Dettinger, MD   PROBIOTIC DAILY PO Take by mouth daily.   Stool Softener 100 MG capsule Generic drug: docusate sodium Take 100 mg by mouth daily.   vitamin C 100 MG tablet Take 100 mg by mouth daily.   Vitamin D3 50 MCG (2000 UT) Tabs Take by mouth.   zinc gluconate 50 MG tablet Take 50 mg by mouth daily.        Objective:   BP (!) 188/81   Pulse 86   Ht 5' (1.524 m)   Wt 144 lb 4  oz (65.4 kg)   SpO2 98%   BMI 28.17 kg/m   Wt Readings from Last 3 Encounters:  09/27/20 144 lb 4 oz (65.4 kg)  02/11/18 142 lb 12.8 oz (64.8 kg)  03/23/17 147 lb (66.7 kg)    Physical Exam Vitals and nursing note reviewed.  Constitutional:      General: She is not in acute distress.    Appearance: She is well-developed. She is not diaphoretic.  Eyes:     Conjunctiva/sclera: Conjunctivae normal.  Cardiovascular:     Rate and Rhythm: Normal rate and regular rhythm.     Heart sounds: Normal heart sounds. No murmur heard.   Pulmonary:      Effort: Pulmonary effort is normal. No respiratory distress.     Breath sounds: Normal breath sounds. No wheezing.  Musculoskeletal:        General: No tenderness. Normal range of motion.  Skin:    General: Skin is warm and dry.     Findings: No rash.  Neurological:     Mental Status: She is alert and oriented to person, place, and time.     Coordination: Coordination normal.  Psychiatric:        Behavior: Behavior normal.       Assessment & Plan:   Problem List Items Addressed This Visit      Endocrine   Hypothyroidism - Primary   Relevant Orders   TSH     Other   Dyslipidemia   Relevant Orders   CBC with Differential/Platelet   Lipid panel    Other Visit Diagnoses    Primary hypertension       Relevant Medications   hydrochlorothiazide (HYDRODIURIL) 25 MG tablet   Other Relevant Orders   CBC with Differential/Platelet   CMP14+EGFR   BMP8+EGFR    Will start hydrochlorothiazide for blood pressure.  Recheck kidneys in 2 weeks.  Cholesterol will be checked with blood work and thyroid check her blood work.  Follow up plan: Return in about 3 months (around 12/28/2020), or if symptoms worsen or fail to improve, for Hypertension and thyroid recheck.  Counseling provided for all of the vaccine components Orders Placed This Encounter  Procedures  . CBC with Differential/Platelet  . CMP14+EGFR  . Lipid panel  . TSH  . BMP8+EGFR    Caryl Pina, MD [facility] 09/27/2020, 11:43 AM

## 2020-09-28 LAB — CMP14+EGFR
ALT: 26 IU/L (ref 0–32)
AST: 27 IU/L (ref 0–40)
Albumin/Globulin Ratio: 1.4 (ref 1.2–2.2)
Albumin: 4.6 g/dL (ref 3.8–4.8)
Alkaline Phosphatase: 102 IU/L (ref 44–121)
BUN/Creatinine Ratio: 16 (ref 12–28)
BUN: 14 mg/dL (ref 8–27)
Bilirubin Total: 0.5 mg/dL (ref 0.0–1.2)
CO2: 22 mmol/L (ref 20–29)
Calcium: 9.5 mg/dL (ref 8.7–10.3)
Chloride: 98 mmol/L (ref 96–106)
Creatinine, Ser: 0.85 mg/dL (ref 0.57–1.00)
Globulin, Total: 3.2 g/dL (ref 1.5–4.5)
Glucose: 107 mg/dL — ABNORMAL HIGH (ref 65–99)
Potassium: 4.4 mmol/L (ref 3.5–5.2)
Sodium: 140 mmol/L (ref 134–144)
Total Protein: 7.8 g/dL (ref 6.0–8.5)
eGFR: 74 mL/min/{1.73_m2} (ref >59)

## 2020-09-28 LAB — CBC WITH DIFFERENTIAL/PLATELET
Basophils Absolute: 0 10*3/uL (ref 0.0–0.2)
Basos: 1 %
EOS (ABSOLUTE): 0.1 10*3/uL (ref 0.0–0.4)
Eos: 2 %
Hematocrit: 38.6 % (ref 34.0–46.6)
Hemoglobin: 12.4 g/dL (ref 11.1–15.9)
Immature Grans (Abs): 0 10*3/uL (ref 0.0–0.1)
Immature Granulocytes: 0 %
Lymphocytes Absolute: 2.9 10*3/uL (ref 0.7–3.1)
Lymphs: 36 %
MCH: 26.6 pg (ref 26.6–33.0)
MCHC: 32.1 g/dL (ref 31.5–35.7)
MCV: 83 fL (ref 79–97)
Monocytes Absolute: 0.5 10*3/uL (ref 0.1–0.9)
Monocytes: 6 %
Neutrophils Absolute: 4.6 10*3/uL (ref 1.4–7.0)
Neutrophils: 55 %
Platelets: 264 10*3/uL (ref 150–450)
RBC: 4.66 x10E6/uL (ref 3.77–5.28)
RDW: 13.6 % (ref 11.7–15.4)
WBC: 8.2 10*3/uL (ref 3.4–10.8)

## 2020-09-28 LAB — LIPID PANEL
Chol/HDL Ratio: 4.2 ratio (ref 0.0–4.4)
Cholesterol, Total: 200 mg/dL — ABNORMAL HIGH (ref 100–199)
HDL: 48 mg/dL (ref >39)
LDL Chol Calc (NIH): 127 mg/dL — ABNORMAL HIGH (ref 0–99)
Triglycerides: 141 mg/dL (ref 0–149)
VLDL Cholesterol Cal: 25 mg/dL (ref 5–40)

## 2020-09-28 LAB — TSH: TSH: 1.86 u[IU]/mL (ref 0.450–4.500)

## 2020-10-02 ENCOUNTER — Other Ambulatory Visit: Payer: Self-pay

## 2020-10-02 ENCOUNTER — Other Ambulatory Visit: Payer: Self-pay | Admitting: Family Medicine

## 2020-10-02 DIAGNOSIS — E785 Hyperlipidemia, unspecified: Secondary | ICD-10-CM

## 2020-10-02 DIAGNOSIS — I10 Essential (primary) hypertension: Secondary | ICD-10-CM

## 2020-10-02 MED ORDER — LEVOTHYROXINE SODIUM 75 MCG PO TABS: 75.0000 ug | ORAL_TABLET | Freq: Every day | ORAL | 3 refills | Status: DC

## 2020-10-22 ENCOUNTER — Other Ambulatory Visit: Payer: PPO

## 2020-10-22 ENCOUNTER — Other Ambulatory Visit: Payer: Self-pay

## 2020-10-22 DIAGNOSIS — I10 Essential (primary) hypertension: Secondary | ICD-10-CM | POA: Diagnosis not present

## 2020-10-22 DIAGNOSIS — E785 Hyperlipidemia, unspecified: Secondary | ICD-10-CM

## 2020-10-23 LAB — LIPID PANEL
Chol/HDL Ratio: 4 ratio (ref 0.0–4.4)
Cholesterol, Total: 187 mg/dL (ref 100–199)
HDL: 47 mg/dL (ref >39)
LDL Chol Calc (NIH): 108 mg/dL — ABNORMAL HIGH (ref 0–99)
Triglycerides: 187 mg/dL — ABNORMAL HIGH (ref 0–149)
VLDL Cholesterol Cal: 32 mg/dL (ref 5–40)

## 2020-10-23 LAB — BMP8+EGFR
BUN/Creatinine Ratio: 14 (ref 12–28)
BUN: 11 mg/dL (ref 8–27)
CO2: 23 mmol/L (ref 20–29)
Calcium: 9.5 mg/dL (ref 8.7–10.3)
Chloride: 92 mmol/L — ABNORMAL LOW (ref 96–106)
Creatinine, Ser: 0.79 mg/dL (ref 0.57–1.00)
Glucose: 94 mg/dL (ref 65–99)
Potassium: 3.4 mmol/L — ABNORMAL LOW (ref 3.5–5.2)
Sodium: 132 mmol/L — ABNORMAL LOW (ref 134–144)
eGFR: 81 mL/min/{1.73_m2} (ref >59)

## 2021-01-01 ENCOUNTER — Ambulatory Visit (INDEPENDENT_AMBULATORY_CARE_PROVIDER_SITE_OTHER): Payer: PPO | Admitting: Family Medicine

## 2021-01-01 ENCOUNTER — Other Ambulatory Visit: Payer: Self-pay

## 2021-01-01 ENCOUNTER — Encounter: Payer: Self-pay | Admitting: Family Medicine

## 2021-01-01 VITALS — BP 150/78 | HR 91 | Ht 60.0 in | Wt 142.0 lb

## 2021-01-01 DIAGNOSIS — E785 Hyperlipidemia, unspecified: Secondary | ICD-10-CM

## 2021-01-01 DIAGNOSIS — E039 Hypothyroidism, unspecified: Secondary | ICD-10-CM

## 2021-01-01 NOTE — Progress Notes (Signed)
BP (!) 150/78   Pulse 91   Ht 5' (1.524 m)   Wt 142 lb (64.4 kg)   SpO2 98%   BMI 27.73 kg/m    Subjective:   Patient ID: Margaret Stevens, female    DOB: Nov 23, 1950, 70 y.o.   MRN: [id number]  HPI: Margaret Stevens is a 70 y.o. female presenting on 01/01/2021 for Medical Management of Chronic Issues, Hypertension, and Hypothyroidism   HPI Hypothyroidism recheck Patient is coming in for thyroid recheck today as well. They deny any issues with hair changes or heat or cold problems or diarrhea or constipation. They deny any chest pain or palpitations. They are currently on levothyroxine 75 micrograms   Hypertension Patient is currently on hydrochlorothiazide, and their blood pressure today is 150/78. Patient denies any lightheadedness or dizziness. Patient denies headaches, blurred vision, chest pains, shortness of breath, or weakness. Denies any side effects from medication and is content with current medication.   Relevant past medical, surgical, family and social history reviewed and updated as indicated. Interim medical history since our last visit reviewed. Allergies and medications reviewed and updated.  Review of Systems  Constitutional:  Negative for chills and fever.  Eyes:  Negative for visual disturbance.  Respiratory:  Negative for chest tightness and shortness of breath.   Cardiovascular:  Negative for chest pain and leg swelling.  Musculoskeletal:  Negative for back pain and gait problem.  Skin:  Negative for rash.  Neurological:  Negative for dizziness, light-headedness and headaches.  Psychiatric/Behavioral:  Negative for agitation and behavioral problems.   All other systems reviewed and are negative.  Per HPI unless specifically indicated above   Allergies as of 01/01/2021   No Known Allergies      Medication List        Accurate as of January 01, 2021  1:32 PM. If you have any questions, ask your nurse or doctor.          STOP taking these medications     cetirizine 10 MG tablet Commonly known as: ZYRTEC Stopped by: Elige Radon Jase Himmelberger, MD       TAKE these medications    Cinnamon 500 MG capsule Take 500 mg by mouth daily.   docusate sodium 100 MG capsule Commonly known as: COLACE Take 100 mg by mouth daily.   HAWTHORN BERRIES PO Take by mouth.   hydrochlorothiazide 25 MG tablet Commonly known as: HYDRODIURIL Take 1 tablet (25 mg total) by mouth daily.   KRILL OIL PLUS PO Take by mouth 2 (two) times daily.   levothyroxine 75 MCG tablet Commonly known as: SYNTHROID Take 1 tablet (75 mcg total) by mouth daily.   loratadine 10 MG tablet Commonly known as: CLARITIN Take 10 mg by mouth daily.   PROBIOTIC DAILY PO Take by mouth daily.   vitamin C 100 MG tablet Take 100 mg by mouth daily.   Vitamin D3 50 MCG (2000 UT) Tabs Take by mouth.   zinc gluconate 50 MG tablet Take 50 mg by mouth daily.         Objective:   BP (!) 150/78   Pulse 91   Ht 5' (1.524 m)   Wt 142 lb (64.4 kg)   SpO2 98%   BMI 27.73 kg/m   Wt Readings from Last 3 Encounters:  01/01/21 142 lb (64.4 kg)  09/27/20 144 lb 4 oz (65.4 kg)  02/11/18 142 lb 12.8 oz (64.8 kg)    Physical Exam Vitals and nursing note reviewed.  Constitutional:      General: She is not in acute distress.    Appearance: She is well-developed. She is not diaphoretic.  Eyes:     Conjunctiva/sclera: Conjunctivae normal.  Cardiovascular:     Rate and Rhythm: Normal rate and regular rhythm.     Heart sounds: Normal heart sounds. No murmur heard. Pulmonary:     Effort: Pulmonary effort is normal. No respiratory distress.     Breath sounds: Normal breath sounds. No wheezing.  Skin:    General: Skin is warm and dry.     Findings: No rash.  Neurological:     Mental Status: She is alert and oriented to person, place, and time.     Coordination: Coordination normal.  Psychiatric:        Behavior: Behavior normal.      Assessment & Plan:   Problem List  Items Addressed This Visit       Endocrine   Hypothyroidism - Primary   Relevant Orders   TSH     Other   Dyslipidemia    Any current medication, no changes, will check blood work Follow up plan: Return in about 3 months (around 04/03/2021), or if symptoms worsen or fail to improve, for Thyroid and cholesterol.  Counseling provided for all of the vaccine components Orders Placed This Encounter  Procedures   TSH    Arville Care, MD [facility] 01/01/2021, 1:32 PM

## 2021-01-02 LAB — TSH: TSH: 3.34 u[IU]/mL (ref 0.450–4.500)

## 2021-01-22 ENCOUNTER — Telehealth: Payer: Self-pay | Admitting: Family Medicine

## 2021-01-22 NOTE — Telephone Encounter (Cosign Needed)
Spoke with Margaret Stevens over the phone to ask about colorectal cancer screening. She denies screening right now. While on the phone she asked about the labs from the last visit. She only received the results for the TSH and wanted to know why she didn't see the lipid panel. I let her know that her lipid labs are drawn about every 6 months so they were not drawn on her visit on 6/29. The TSH were the only labs drawn. She was ok with this.

## 2021-03-31 ENCOUNTER — Telehealth: Payer: Self-pay | Admitting: Family Medicine

## 2021-03-31 DIAGNOSIS — I10 Essential (primary) hypertension: Secondary | ICD-10-CM

## 2021-03-31 DIAGNOSIS — E785 Hyperlipidemia, unspecified: Secondary | ICD-10-CM

## 2021-03-31 DIAGNOSIS — E039 Hypothyroidism, unspecified: Secondary | ICD-10-CM

## 2021-03-31 NOTE — Telephone Encounter (Signed)
Placed lab orders for patient to come in and do, recommend that not coming between 8 and 840 because of how busy it is in the mornings.

## 2021-04-04 ENCOUNTER — Ambulatory Visit: Payer: PPO | Admitting: Family Medicine

## 2021-04-21 ENCOUNTER — Other Ambulatory Visit: Payer: Self-pay

## 2021-04-21 ENCOUNTER — Ambulatory Visit (INDEPENDENT_AMBULATORY_CARE_PROVIDER_SITE_OTHER): Payer: PPO | Admitting: Family Medicine

## 2021-04-21 ENCOUNTER — Encounter: Payer: Self-pay | Admitting: Family Medicine

## 2021-04-21 DIAGNOSIS — I10 Essential (primary) hypertension: Secondary | ICD-10-CM

## 2021-04-21 DIAGNOSIS — E785 Hyperlipidemia, unspecified: Secondary | ICD-10-CM

## 2021-04-21 DIAGNOSIS — E039 Hypothyroidism, unspecified: Secondary | ICD-10-CM

## 2021-04-21 HISTORY — DX: Essential (primary) hypertension: I10

## 2021-04-21 NOTE — Progress Notes (Signed)
BP (!) 161/77   Pulse 90   Ht 5' (1.524 m)   Wt 145 lb (65.8 kg)   SpO2 100%   BMI 28.32 kg/m    Subjective:   Patient ID: Newell Coral, female    DOB: 24-Apr-1951, 70 y.o.   MRN: [id number]  HPI: Audriella Blakeley is a 70 y.o. female presenting on 04/21/2021 for Medical Management of Chronic Issues and Hypothyroidism   HPI Hypothyroidism recheck Patient is coming in for thyroid recheck today as well. They deny any issues with hair changes or heat or cold problems or diarrhea or constipation. They deny any chest pain or palpitations. They are currently on levothyroxine   Hypertension Patient is currently on hydrochlorothiazide, and their blood pressure today is 161/77, at home allways run good at home 130/77. Patient denies any lightheadedness or dizziness. Patient denies headaches, blurred vision, chest pains, shortness of breath, or weakness. Denies any side effects from medication and is content with current medication.   Hyperlipidemia Patient is coming in for recheck of his hyperlipidemia. The patient is currently taking fish oil. They deny any issues with myalgias or history of liver damage from it. They deny any focal numbness or weakness or chest pain.   Relevant past medical, surgical, family and social history reviewed and updated as indicated. Interim medical history since our last visit reviewed. Allergies and medications reviewed and updated.  Review of Systems  Constitutional:  Negative for chills and fever.  HENT:  Negative for congestion, ear discharge and ear pain.   Eyes:  Negative for redness and visual disturbance.  Respiratory:  Negative for chest tightness and shortness of breath.   Cardiovascular:  Negative for chest pain and leg swelling.  Genitourinary:  Negative for difficulty urinating and dysuria.  Musculoskeletal:  Negative for back pain and gait problem.  Skin:  Negative for rash.  Neurological:  Negative for light-headedness and  headaches.  Psychiatric/Behavioral:  Negative for agitation and behavioral problems.   All other systems reviewed and are negative.  Per HPI unless specifically indicated above   Allergies as of 04/21/2021   No Known Allergies      Medication List        Accurate as of April 21, 2021 11:40 AM. If you have any questions, ask your nurse or doctor.          Cinnamon 500 MG capsule Take 500 mg by mouth daily.   docusate sodium 100 MG capsule Commonly known as: COLACE Take 100 mg by mouth daily.   HAWTHORN BERRIES PO Take by mouth.   hydrochlorothiazide 25 MG tablet Commonly known as: HYDRODIURIL Take 1 tablet (25 mg total) by mouth daily.   KRILL OIL PLUS PO Take by mouth 2 (two) times daily.   levothyroxine 75 MCG tablet Commonly known as: SYNTHROID Take 1 tablet (75 mcg total) by mouth daily.   loratadine 10 MG tablet Commonly known as: CLARITIN Take 10 mg by mouth daily.   PROBIOTIC DAILY PO Take by mouth daily.   vitamin C 100 MG tablet Take 100 mg by mouth daily.   Vitamin D3 50 MCG (2000 UT) Tabs Take by mouth.   zinc gluconate 50 MG tablet Take 50 mg by mouth daily.         Objective:   BP (!) 161/77   Pulse 90   Ht 5' (1.524 m)   Wt 145 lb (65.8 kg)   SpO2 100%   BMI 28.32 kg/m   Wt Readings from  Last 3 Encounters:  04/21/21 145 lb (65.8 kg)  01/01/21 142 lb (64.4 kg)  09/27/20 144 lb 4 oz (65.4 kg)    Physical Exam Vitals and nursing note reviewed.  Constitutional:      General: She is not in acute distress.    Appearance: She is well-developed. She is not diaphoretic.  Eyes:     Conjunctiva/sclera: Conjunctivae normal.  Cardiovascular:     Rate and Rhythm: Normal rate and regular rhythm.     Heart sounds: Normal heart sounds. No murmur heard. Pulmonary:     Effort: Pulmonary effort is normal. No respiratory distress.     Breath sounds: Normal breath sounds. No wheezing.  Musculoskeletal:        General: No tenderness.  Normal range of motion.  Skin:    General: Skin is warm and dry.     Findings: No rash.  Neurological:     Mental Status: She is alert and oriented to person, place, and time.     Coordination: Coordination normal.  Psychiatric:        Behavior: Behavior normal.      Assessment & Plan:   Problem List Items Addressed This Visit       Endocrine   Hypothyroidism     Other   Dyslipidemia   Other Visit Diagnoses     Primary hypertension         Continue current medicine, will check blood work, follow-up in 6 months  Follow up plan: Return in about 6 months (around 10/20/2021), or if symptoms worsen or fail to improve, for Thyroid and cholesterol and hypertension.  Counseling provided for all of the vaccine components No orders of the defined types were placed in this encounter.   Arville Care, MD [facility] 04/21/2021, 11:40 AM

## 2021-04-22 LAB — CMP14+EGFR
ALT: 26 IU/L (ref 0–32)
AST: 23 IU/L (ref 0–40)
Albumin/Globulin Ratio: 1.4 (ref 1.2–2.2)
Albumin: 4.6 g/dL (ref 3.8–4.8)
Alkaline Phosphatase: 93 IU/L (ref 44–121)
BUN/Creatinine Ratio: 20 (ref 12–28)
BUN: 17 mg/dL (ref 8–27)
Bilirubin Total: 0.4 mg/dL (ref 0.0–1.2)
CO2: 24 mmol/L (ref 20–29)
Calcium: 9.6 mg/dL (ref 8.7–10.3)
Chloride: 90 mmol/L — ABNORMAL LOW (ref 96–106)
Creatinine, Ser: 0.85 mg/dL (ref 0.57–1.00)
Globulin, Total: 3.2 g/dL (ref 1.5–4.5)
Glucose: 116 mg/dL — ABNORMAL HIGH (ref 70–99)
Potassium: 4.3 mmol/L (ref 3.5–5.2)
Sodium: 131 mmol/L — ABNORMAL LOW (ref 134–144)
Total Protein: 7.8 g/dL (ref 6.0–8.5)
eGFR: 74 mL/min/{1.73_m2} (ref >59)

## 2021-04-22 LAB — CBC WITH DIFFERENTIAL/PLATELET
Basophils Absolute: 0.1 10*3/uL (ref 0.0–0.2)
Basos: 1 %
EOS (ABSOLUTE): 0.2 10*3/uL (ref 0.0–0.4)
Eos: 3 %
Hematocrit: 37.6 % (ref 34.0–46.6)
Hemoglobin: 11.9 g/dL (ref 11.1–15.9)
Immature Grans (Abs): 0 10*3/uL (ref 0.0–0.1)
Immature Granulocytes: 0 %
Lymphocytes Absolute: 2.8 10*3/uL (ref 0.7–3.1)
Lymphs: 33 %
MCH: 25.5 pg — ABNORMAL LOW (ref 26.6–33.0)
MCHC: 31.6 g/dL (ref 31.5–35.7)
MCV: 81 fL (ref 79–97)
Monocytes Absolute: 0.5 10*3/uL (ref 0.1–0.9)
Monocytes: 6 %
Neutrophils Absolute: 4.8 10*3/uL (ref 1.4–7.0)
Neutrophils: 57 %
Platelets: 302 10*3/uL (ref 150–450)
RBC: 4.66 x10E6/uL (ref 3.77–5.28)
RDW: 14.3 % (ref 11.7–15.4)
WBC: 8.5 10*3/uL (ref 3.4–10.8)

## 2021-04-22 LAB — LIPID PANEL
Chol/HDL Ratio: 4.4 ratio (ref 0.0–4.4)
Cholesterol, Total: 201 mg/dL — ABNORMAL HIGH (ref 100–199)
HDL: 46 mg/dL (ref >39)
LDL Chol Calc (NIH): 122 mg/dL — ABNORMAL HIGH (ref 0–99)
Triglycerides: 187 mg/dL — ABNORMAL HIGH (ref 0–149)
VLDL Cholesterol Cal: 33 mg/dL (ref 5–40)

## 2021-04-22 LAB — TSH: TSH: 7.87 u[IU]/mL — ABNORMAL HIGH (ref 0.450–4.500)

## 2021-04-25 ENCOUNTER — Other Ambulatory Visit: Payer: Self-pay

## 2021-04-25 DIAGNOSIS — E039 Hypothyroidism, unspecified: Secondary | ICD-10-CM

## 2021-04-25 MED ORDER — LEVOTHYROXINE SODIUM 88 MCG PO TABS: 88.0000 ug | ORAL_TABLET | Freq: Every day | ORAL | 1 refills | Status: DC

## 2021-06-04 ENCOUNTER — Ambulatory Visit (INDEPENDENT_AMBULATORY_CARE_PROVIDER_SITE_OTHER): Payer: PPO

## 2021-06-04 VITALS — Ht 61.0 in | Wt 145.0 lb

## 2021-06-04 DIAGNOSIS — Z0000 Encounter for general adult medical examination without abnormal findings: Secondary | ICD-10-CM

## 2021-06-04 NOTE — Patient Instructions (Signed)
Margaret Stevens , Thank you for taking time to come for your Medicare Wellness Visit. I appreciate your ongoing commitment to your health goals. Please review the following plan we discussed and let me know if I can assist you in the future.   Screening recommendations/referrals: Colonoscopy: Call office to schedule at your convenience. Mammogram: Call office to schedule at your convenience. Bone Density: Call office to schedule at your convenience.  Recommended yearly ophthalmology/optometry visit for glaucoma screening and checkup Recommended yearly dental visit for hygiene and checkup  Vaccinations: Influenza vaccine: Done 04/29/2021 Repeat annually  Pneumococcal vaccine: Due Tdap vaccine: Due Repeat in 10 years  Shingles vaccine: Shingrix discussed. Please contact your pharmacy for coverage information.     Covid-19:Done 10/16/2019, 08/09/2020 and 05/15/2021.  Advanced directives: Advance directive discussed with you today. Pick up  a copy from [facility] and have it notarized. Once this is complete please bring a copy in to our office so we can scan it into your chart.  Conditions/risks identified: Aim for 30 minutes of exercise or brisk walking each day, drink 6-8 glasses of water and eat lots of fruits and vegetables.   Next appointment: Follow up in one year for your annual wellness visit 2023.   Preventive Care 70 Years and Older, Female Preventive care refers to lifestyle choices and visits with your health care provider that can promote health and wellness. What does preventive care include? A yearly physical exam. This is also called an annual well check. Dental exams once or twice a year. Routine eye exams. Ask your health care provider how often you should have your eyes checked. Personal lifestyle choices, including: Daily care of your teeth and gums. Regular physical activity. Eating a healthy diet. Avoiding tobacco and drug use. Limiting alcohol use. Practicing safe  sex. Taking low-dose aspirin every day. Taking vitamin and mineral supplements as recommended by your health care provider. What happens during an annual well check? The services and screenings done by your health care provider during your annual well check will depend on your age, overall health, lifestyle risk factors, and family history of disease. Counseling  Your health care provider may ask you questions about your: Alcohol use. Tobacco use. Drug use. Emotional well-being. Home and relationship well-being. Sexual activity. Eating habits. History of falls. Memory and ability to understand (cognition). Work and work Astronomer. Reproductive health. Screening  You may have the following tests or measurements: Height, weight, and BMI. Blood pressure. Lipid and cholesterol levels. These may be checked every 5 years, or more frequently if you are over 70 years old. Skin check. Lung cancer screening. You may have this screening every year starting at age 70 if you have a 30-pack-year history of smoking and currently smoke or have quit within the past 15 years. Fecal occult blood test (FOBT) of the stool. You may have this test every year starting at age 70. Flexible sigmoidoscopy or colonoscopy. You may have a sigmoidoscopy every 5 years or a colonoscopy every 10 years starting at age 70. Hepatitis C blood test. Hepatitis B blood test. Sexually transmitted disease (STD) testing. Diabetes screening. This is done by checking your blood sugar (glucose) after you have not eaten for a while (fasting). You may have this done every 1-3 years. Bone density scan. This is done to screen for osteoporosis. You may have this done starting at age 70. Mammogram. This may be done every 1-2 years. Talk to your health care provider about how often you should have regular  mammograms. Talk with your health care provider about your test results, treatment options, and if necessary, the need for more  tests. Vaccines  Your health care provider may recommend certain vaccines, such as: Influenza vaccine. This is recommended every year. Tetanus, diphtheria, and acellular pertussis (Tdap, Td) vaccine. You may need a Td booster every 10 years. Zoster vaccine. You may need this after age 70. Pneumococcal 13-valent conjugate (PCV13) vaccine. One dose is recommended after age 70. Pneumococcal polysaccharide (PPSV23) vaccine. One dose is recommended after age 70. Talk to your health care provider about which screenings and vaccines you need and how often you need them. This information is not intended to replace advice given to you by your health care provider. Make sure you discuss any questions you have with your health care provider. Document Released: 07/19/2015 Document Revised: 03/11/2016 Document Reviewed: 04/23/2015 Elsevier Interactive Patient Education  2017 [place] Prevention in the Home Falls can cause injuries. They can happen to people of all ages. There are many things you can do to make your home safe and to help prevent falls. What can I do on the outside of my home? Regularly fix the edges of walkways and driveways and fix any cracks. Remove anything that might make you trip as you walk through a door, such as a raised step or threshold. Trim any bushes or trees on the path to your home. Use bright outdoor lighting. Clear any walking paths of anything that might make someone trip, such as rocks or tools. Regularly check to see if handrails are loose or broken. Make sure that both sides of any steps have handrails. Any raised decks and porches should have guardrails on the edges. Have any leaves, snow, or ice cleared regularly. Use sand or salt on walking paths during winter. Clean up any spills in your garage right away. This includes oil or grease spills. What can I do in the bathroom? Use night lights. Install grab bars by the toilet and in the tub and shower.  Do not use towel bars as grab bars. Use non-skid mats or decals in the tub or shower. If you need to sit down in the shower, use a plastic, non-slip stool. Keep the floor dry. Clean up any water that spills on the floor as soon as it happens. Remove soap buildup in the tub or shower regularly. Attach bath mats securely with double-sided non-slip rug tape. Do not have throw rugs and other things on the floor that can make you trip. What can I do in the bedroom? Use night lights. Make sure that you have a light by your bed that is easy to reach. Do not use any sheets or blankets that are too big for your bed. They should not hang down onto the floor. Have a firm chair that has side arms. You can use this for support while you get dressed. Do not have throw rugs and other things on the floor that can make you trip. What can I do in the kitchen? Clean up any spills right away. Avoid walking on wet floors. Keep items that you use a lot in easy-to-reach places. If you need to reach something above you, use a strong step stool that has a grab bar. Keep electrical cords out of the way. Do not use floor polish or wax that makes floors slippery. If you must use wax, use non-skid floor wax. Do not have throw rugs and other things on the floor that can  make you trip. What can I do with my stairs? Do not leave any items on the stairs. Make sure that there are handrails on both sides of the stairs and use them. Fix handrails that are broken or loose. Make sure that handrails are as long as the stairways. Check any carpeting to make sure that it is firmly attached to the stairs. Fix any carpet that is loose or worn. Avoid having throw rugs at the top or bottom of the stairs. If you do have throw rugs, attach them to the floor with carpet tape. Make sure that you have a light switch at the top of the stairs and the bottom of the stairs. If you do not have them, ask someone to add them for you. What else  can I do to help prevent falls? Wear shoes that: Do not have high heels. Have rubber bottoms. Are comfortable and fit you well. Are closed at the toe. Do not wear sandals. If you use a stepladder: Make sure that it is fully opened. Do not climb a closed stepladder. Make sure that both sides of the stepladder are locked into place. Ask someone to hold it for you, if possible. Clearly mark and make sure that you can see: Any grab bars or handrails. First and last steps. Where the edge of each step is. Use tools that help you move around (mobility aids) if they are needed. These include: Canes. Walkers. Scooters. Crutches. Turn on the lights when you go into a dark area. Replace any light bulbs as soon as they burn out. Set up your furniture so you have a clear path. Avoid moving your furniture around. If any of your floors are uneven, fix them. If there are any pets around you, be aware of where they are. Review your medicines with your doctor. Some medicines can make you feel dizzy. This can increase your chance of falling. Ask your doctor what other things that you can do to help prevent falls. This information is not intended to replace advice given to you by your health care provider. Make sure you discuss any questions you have with your health care provider. Document Released: 04/18/2009 Document Revised: 11/28/2015 Document Reviewed: 07/27/2014 Elsevier Interactive Patient Education  2017 [employer].

## 2021-06-04 NOTE — Progress Notes (Signed)
Subjective:   Margaret Stevens is a 70 y.o. female who presents for an Initial Medicare Annual Wellness Visit. Virtual Visit via Telephone Note  I connected with  Margaret Stevens on 06/04/21 at  2:45 PM EST by telephone and verified that I am speaking with the correct person using two identifiers.  Location: Patient: Home Provider: [facility] Persons participating in the virtual visit: patient/Nurse Health Advisor   I discussed the limitations, risks, security and privacy concerns of performing an evaluation and management service by telephone and the availability of in person appointments. The patient expressed understanding and agreed to proceed.  Interactive audio and video telecommunications were attempted between this nurse and patient, however failed, due to patient having technical difficulties OR patient did not have access to video capability.  We continued and completed visit with audio only.  Some vital signs may be absent or patient reported.   Chriss Driver, LPN  Review of Systems     Cardiac Risk Factors include: advanced age (>30mn, >>22women);hypertension;dyslipidemia;sedentary lifestyle     Objective:    Today's Vitals   06/04/21 1433  Weight: 145 lb (65.8 kg)  Height: 5' 1"  (1.549 m)   Body mass index is 27.4 kg/m.  Advanced Directives 06/04/2021  Does Patient Have a Medical Advance Directive? No  Would patient like information on creating a medical advance directive? No - Patient declined    Current Medications (verified) Outpatient Encounter Medications as of 06/04/2021  Medication Sig   Ascorbic Acid (VITAMIN C) 100 MG tablet Take 100 mg by mouth daily.   BINAXNOW COVID-19 AG HOME TEST KIT Use as Directed on the Package   Cholecalciferol (VITAMIN D3) 2000 units TABS Take by mouth.   Cinnamon 500 MG capsule Take 500 mg by mouth daily.   docusate sodium (COLACE) 100 MG capsule Take 100 mg by mouth daily.   Fish Oil-Krill Oil (KRILL OIL PLUS PO) Take  by mouth 2 (two) times daily.    FLUZONE HIGH-DOSE QUADRIVALENT 0.7 ML SUSY    HAWTHORN BERRIES PO Take by mouth.   hydrochlorothiazide (HYDRODIURIL) 25 MG tablet Take 1 tablet (25 mg total) by mouth daily.   levothyroxine (SYNTHROID) 88 MCG tablet Take 1 tablet (88 mcg total) by mouth daily.   loratadine (CLARITIN) 10 MG tablet Take 10 mg by mouth daily.   Probiotic Product (PROBIOTIC DAILY PO) Take by mouth daily.   zinc gluconate 50 MG tablet Take 50 mg by mouth daily.   No facility-administered encounter medications on file as of 06/04/2021.    Allergies (verified) Patient has no known allergies.   History: Past Medical History:  Diagnosis Date   Hypertension 04/21/2021   Thyroid disease    History reviewed. No pertinent surgical history. Family History  Problem Relation Age of Onset   Hypertension Mother    Heart disease Father    Arthritis Father    COPD Father    Learning disabilities Father    Hypertension Father    Diabetes Brother    Learning disabilities Brother    Cancer Maternal Grandmother        skin   Depression Maternal Grandmother    Stroke Maternal Grandmother    Mental illness Maternal Grandmother    Hypertension Maternal Grandmother    Hypertension Maternal Grandfather    Heart disease Paternal Grandmother    Heart disease Paternal Grandfather    Social History   Socioeconomic History   Marital status: Married    Spouse name: AArnell Sieving  Number of children: 1   Years of education: Not on file   Highest education level: Not on file  Occupational History   Not on file  Tobacco Use   Smoking status: Never   Smokeless tobacco: Never  Vaping Use   Vaping Use: Never used  Substance and Sexual Activity   Alcohol use: No   Drug use: No   Sexual activity: Yes    Birth control/protection: None    Comment: married 35 years  Other Topics Concern   Not on file  Social History Narrative   Married x 48 years in 2022.   1 son   2 granchildren    Social Determinants of Radio broadcast assistant Strain: Low Risk    Difficulty of Paying Living Expenses: Not hard at all  Food Insecurity: No Food Insecurity   Worried About Charity fundraiser in the Last Year: Never true   Arboriculturist in the Last Year: Never true  Transportation Needs: No Transportation Needs   Lack of Transportation (Medical): No   Lack of Transportation (Non-Medical): No  Physical Activity: Sufficiently Active   Days of Exercise per Week: 5 days   Minutes of Exercise per Session: 30 min  Stress: No Stress Concern Present   Feeling of Stress : Not at all  Social Connections: Socially Integrated   Frequency of Communication with Friends and Family: Three times a week   Frequency of Social Gatherings with Friends and Family: Three times a week   Attends Religious Services: 1 to 4 times per year   Active Member of Clubs or Organizations: Yes   Attends Archivist Meetings: 1 to 4 times per year   Marital Status: Married    Tobacco Counseling Counseling given: Not Answered   Clinical Intake:  Pre-visit preparation completed: Yes  Pain : No/denies pain     BMI - recorded: 27.4 Nutritional Status: BMI 25 -29 Overweight Nutritional Risks: None Diabetes: No     Diabetic?No  Interpreter Needed?: No  Information entered by :: Traci Sermon, LPN   Activities of Daily Living In your present state of health, do you have any difficulty performing the following activities: 06/04/2021  Hearing? N  Vision? N  Difficulty concentrating or making decisions? Y  Comment Some memory issues.  Walking or climbing stairs? N  Dressing or bathing? N  Doing errands, shopping? N  Preparing Food and eating ? N  Using the Toilet? N  In the past six months, have you accidently leaked urine? N  Do you have problems with loss of bowel control? N  Managing your Medications? N  Managing your Finances? N  Housekeeping or managing your Housekeeping? N   Some recent data might be hidden    Patient Care Team: Dettinger, Fransisca Kaufmann, MD as PCP - General (Family Medicine)  Indicate any recent Medical Services you may have received from other than [facility] providers in the past year (date may be approximate).     Assessment:   This is a routine wellness examination for [facility].  Hearing/Vision screen Hearing Screening - Comments:: Some hearing issues per pt.  Vision Screening - Comments:: Readers. [facility] 2019.  Dietary issues and exercise activities discussed: Current Exercise Habits: Home exercise routine, Type of exercise: walking;stretching, Time (Minutes): 30, Frequency (Times/Week): 5, Weekly Exercise (Minutes/Week): 150, Intensity: Mild, Exercise limited by: cardiac condition(s)   Goals Addressed  This Visit's Progress    DIET - REDUCE CALORIE INTAKE       Would like to lose weight, ride exercise bike more and do hobbies more.        Depression Screen PHQ 2/9 Scores 06/04/2021 04/21/2021 01/01/2021 09/27/2020 02/11/2018 03/23/2017 04/27/2016  PHQ - 2 Score 0 0 0 0 0 0 0    Fall Risk Fall Risk  06/04/2021 04/21/2021 01/01/2021 09/27/2020 01/30/2020  Falls in the past year? 0 0 0 0 0  Comment - - - - Emmi Telephone Survey: data to providers prior to load  Number falls in past yr: 0 - - - -  Injury with Fall? 0 - - - -  Risk Factor Category  - - - - -  Risk for fall due to : No Fall Risks - - - -  Follow up Falls prevention discussed - - - -    FALL RISK PREVENTION PERTAINING TO THE HOME:  Any stairs in or around the home? Yes  If so, are there any without handrails? No  Home free of loose throw rugs in walkways, pet beds, electrical cords, etc? Yes  Adequate lighting in your home to reduce risk of falls? Yes   ASSISTIVE DEVICES UTILIZED TO PREVENT FALLS:  Life alert? No  Use of a cane, walker or w/c? No  Grab bars in the bathroom? Yes  Shower chair or bench in shower? No  Elevated toilet seat or a  handicapped toilet? No   TIMED UP AND GO:  Was the test performed? No .  Phone visit.   Cognitive Function:     6CIT Screen 06/04/2021  What Year? 0 points  What month? 0 points  What time? 0 points  Count back from 20 0 points  Months in reverse 0 points  Repeat phrase 0 points  Total Score 0    Immunizations Immunization History  Administered Date(s) Administered   Fluad Quad(high Dose 65+) 06/20/2020   Influenza, High Dose Seasonal PF 05/20/2018, 05/27/2019   Influenza-Unspecified 05/20/2018   Janssen (J&J) SARS-COV-2 Vaccination 10/16/2019   PFIZER(Purple Top)SARS-COV-2 Vaccination 08/09/2020    TDAP status: Due, Education has been provided regarding the importance of this vaccine. Advised may receive this vaccine at local pharmacy or Health Dept. Aware to provide a copy of the vaccination record if obtained from local pharmacy or Health Dept. Verbalized acceptance and understanding.  Flu Vaccine status: Up to date  Pneumococcal vaccine status: Due, Education has been provided regarding the importance of this vaccine. Advised may receive this vaccine at local pharmacy or Health Dept. Aware to provide a copy of the vaccination record if obtained from local pharmacy or Health Dept. Verbalized acceptance and understanding.  Covid-19 vaccine status: Completed vaccines  Qualifies for Shingles Vaccine? Yes   Zostavax completed No   Shingrix Completed?: No.    Education has been provided regarding the importance of this vaccine. Patient has been advised to call insurance company to determine out of pocket expense if they have not yet received this vaccine. Advised may also receive vaccine at local pharmacy or Health Dept. Verbalized acceptance and understanding.  Screening Tests Health Maintenance  Topic Date Due   Pneumonia Vaccine 51+ Years old (1 - PCV) Never done   Zoster Vaccines- Shingrix (1 of 2) 06/30/2021 (Originally 01/10/2001)   MAMMOGRAM  09/27/2021 (Originally  01/10/2001)   DEXA SCAN  09/27/2021 (Originally 01/11/2016)   COLONOSCOPY (Pts 45-73yr Insurance coverage will need to be confirmed)  09/27/2021 (Originally 01/11/1996)  TETANUS/TDAP  09/27/2021 (Originally 01/10/1970)   INFLUENZA VACCINE  10/03/2021 (Originally 02/03/2021)   COVID-19 Vaccine (3 - Booster for Janssen series) 01/26/2022 (Originally 10/04/2020)   Hepatitis C Screening  Completed   HPV VACCINES  Aged Out    Health Maintenance  Health Maintenance Due  Topic Date Due   Pneumonia Vaccine 81+ Years old (1 - PCV) Never done    Colorectal cancer screening: Referral to GI placed PT DECLINED. Pt aware the office will call re: appt.  Mammogram status: Ordered PT DECLINED. Pt provided with contact info and advised to call to schedule appt.   Bone Density status: Ordered PT DECLINED. Pt provided with contact info and advised to call to schedule appt.  Lung Cancer Screening: (Low Dose CT Chest recommended if Age 29-80 years, 30 pack-year currently smoking OR have quit w/in 15years.) does not qualify.  NON SMOKER  Additional Screening:  Hepatitis C Screening: does qualify; Completed 01/13/2016  Vision Screening: Recommended annual ophthalmology exams for early detection of glaucoma and other disorders of the eye. Is the patient up to date with their annual eye exam?  No  Who is the provider or what is the name of the office in which the patient attends annual eye exams? [facility] If pt is not established with a provider, would they like to be referred to a provider to establish care? No .   Dental Screening: Recommended annual dental exams for proper oral hygiene  Community Resource Referral / Chronic Care Management: CRR required this visit?  No   CCM required this visit?  No      Plan:     I have personally reviewed and noted the following in the patient's chart:   Medical and social history Use of alcohol, tobacco or illicit drugs  Current medications and  supplements including opioid prescriptions. Patient is not currently taking opioid prescriptions. Functional ability and status Nutritional status Physical activity Advanced directives List of other physicians Hospitalizations, surgeries, and ER visits in previous 12 months Vitals Screenings to include cognitive, depression, and falls Referrals and appointments  In addition, I have reviewed and discussed with patient certain preventive protocols, quality metrics, and best practice recommendations. A written personalized care plan for preventive services as well as general preventive health recommendations were provided to patient.     Chriss Driver, LPN   [id number]   Nurse Notes: Phone visit. Pt declines mammogram, colonoscopy/cologurard and bone density exams. Discussed shingles, tdap and pneumococcal vaccines and how to obtain.

## 2021-07-24 ENCOUNTER — Other Ambulatory Visit: Payer: PPO

## 2021-07-24 DIAGNOSIS — E039 Hypothyroidism, unspecified: Secondary | ICD-10-CM

## 2021-07-25 LAB — TSH: TSH: 4.38 u[IU]/mL (ref 0.450–4.500)

## 2021-09-25 ENCOUNTER — Other Ambulatory Visit: Payer: Self-pay

## 2021-09-25 ENCOUNTER — Telehealth: Payer: Self-pay | Admitting: Family Medicine

## 2021-09-25 DIAGNOSIS — I10 Essential (primary) hypertension: Secondary | ICD-10-CM

## 2021-09-25 MED ORDER — HYDROCHLOROTHIAZIDE 25 MG PO TABS: 25.0000 mg | ORAL_TABLET | Freq: Every day | ORAL | 0 refills | Status: DC

## 2021-09-25 NOTE — Telephone Encounter (Signed)
30 day supply of HCTZ sent to [facility] in [place]. ?

## 2021-09-25 NOTE — Telephone Encounter (Signed)
?  Prescription Request ? ?09/25/2021 ? ?Is this a "Controlled Substance" medicine? NO ? ?Have you seen your PCP in the last 2 weeks? No, pt has appt with Dr. Keturah Barre for f/u on 10/20/2021 wants a 30 day supply she is about to run out ? ?If YES, route message to pool  -  If NO, patient needs to be scheduled for appointment. ? ?What is the name of the medication or equipment? hydrochlorothiazide (HYDRODIURIL) 25 MG tablet  ? ?Have you contacted your pharmacy to request a refill? yes  ? ?Which pharmacy would you like this sent to? [facility]  ? ? ?Patient notified that their request is being sent to the clinical staff for review and that they should receive a response within 2 business days.  ? ? ?

## 2021-10-20 ENCOUNTER — Ambulatory Visit (INDEPENDENT_AMBULATORY_CARE_PROVIDER_SITE_OTHER): Payer: PPO

## 2021-10-20 ENCOUNTER — Encounter: Payer: Self-pay | Admitting: Family Medicine

## 2021-10-20 ENCOUNTER — Ambulatory Visit (INDEPENDENT_AMBULATORY_CARE_PROVIDER_SITE_OTHER): Payer: PPO | Admitting: Family Medicine

## 2021-10-20 VITALS — BP 185/79 | HR 88 | Ht 61.0 in | Wt 144.0 lb

## 2021-10-20 DIAGNOSIS — I10 Essential (primary) hypertension: Secondary | ICD-10-CM | POA: Diagnosis not present

## 2021-10-20 DIAGNOSIS — Z780 Asymptomatic menopausal state: Secondary | ICD-10-CM

## 2021-10-20 DIAGNOSIS — E039 Hypothyroidism, unspecified: Secondary | ICD-10-CM

## 2021-10-20 DIAGNOSIS — E785 Hyperlipidemia, unspecified: Secondary | ICD-10-CM | POA: Diagnosis not present

## 2021-10-20 DIAGNOSIS — M8589 Other specified disorders of bone density and structure, multiple sites: Secondary | ICD-10-CM | POA: Diagnosis not present

## 2021-10-20 MED ORDER — HYDROCHLOROTHIAZIDE 25 MG PO TABS: 25.0000 mg | ORAL_TABLET | Freq: Every day | ORAL | 1 refills | Status: DC

## 2021-10-20 MED ORDER — LEVOTHYROXINE SODIUM 88 MCG PO TABS: 88.0000 ug | ORAL_TABLET | Freq: Every day | ORAL | 3 refills | Status: DC

## 2021-10-20 NOTE — Progress Notes (Signed)
? ?BP (!) 185/79   Pulse 88   Ht _0  (1.549 m)   Wt 144 lb (65.3 kg)   SpO2 98%   BMI 27.21 kg/m?   ? ?Subjective:  ? ?Patient ID: Margaret Stevens, female    DOB: February 25, 1951, 71 y.o.   MRN: [id number] ? ?HPI: ?KIDADA GING is a 71 y.o. female presenting on 10/20/2021 for Medical Management of Chronic Issues, Hyperlipidemia, Hypothyroidism, and Hypertension ? ? ?HPI ?Hypothyroidism recheck ?Patient is coming in for thyroid recheck today as well. They deny any issues with hair changes or heat or cold problems or diarrhea or constipation. They deny any chest pain or palpitations. They are currently on levothyroxine 88 micrograms  ? ?Hypertension ?Patient is currently on hydrochlorothiazide, and their blood pressure today is 180/79 but she checks it every day at home and has multiple home blood pressure readings in the 1 35-1 24/70 8-90. Patient denies any lightheadedness or dizziness. Patient denies headaches, blurred vision, chest pains, shortness of breath, or weakness. Denies any side effects from medication and is content with current medication.  ? ?Hyperlipidemia ?Patient is coming in for recheck of his hyperlipidemia. The patient is currently taking fish oil. They deny any issues with myalgias or history of liver damage from it. They deny any focal numbness or weakness or chest pain.  ? ?Relevant past medical, surgical, family and social history reviewed and updated as indicated. Interim medical history since our last visit reviewed. ?Allergies and medications reviewed and updated. ? ?Review of Systems  ?Constitutional:  Negative for chills and fever.  ?Eyes:  Negative for visual disturbance.  ?Respiratory:  Negative for chest tightness and shortness of breath.   ?Cardiovascular:  Negative for chest pain and leg swelling.  ?Musculoskeletal:  Negative for back pain and gait problem.  ?Skin:  Negative for rash.  ?Neurological:  Negative for dizziness, light-headedness and headaches.   ?Psychiatric/Behavioral:  Negative for agitation and behavioral problems.   ?All other systems reviewed and are negative. ? ?Per HPI unless specifically indicated above ? ? ?Allergies as of 10/20/2021   ?No Known Allergies ?  ? ?  ?Medication List  ?  ? ?  ? Accurate as of October 20, 2021  9:08 AM. If you have any questions, ask your nurse or doctor.  ?  ?  ? ?  ? ?BinaxNOW COVID-19 Ag Home Test Kit ?Generic drug: COVID-19 At Home Antigen Test ?Use as Directed on the Package ?  ?Cinnamon 500 MG capsule ?Take 500 mg by mouth daily. ?  ?docusate sodium 100 MG capsule ?Commonly known as: COLACE ?Take 100 mg by mouth daily. ?  ?Fluzone High-Dose Quadrivalent 0.7 ML Susy ?Generic drug: Influenza Vac High-Dose Quad ?  ?HAWTHORN BERRIES PO ?Take by mouth. With grape seed extract ?  ?hydrochlorothiazide 25 MG tablet ?Commonly known as: HYDRODIURIL ?Take 1 tablet (25 mg total) by mouth daily. ?  ?KRILL OIL PLUS PO ?Take by mouth 2 (two) times daily. ?  ?levothyroxine 88 MCG tablet ?Commonly known as: SYNTHROID ?Take 1 tablet (88 mcg total) by mouth daily. ?  ?loratadine 10 MG tablet ?Commonly known as: CLARITIN ?Take 10 mg by mouth daily. ?  ?PROBIOTIC DAILY PO ?Take by mouth daily. ?  ?vitamin C 100 MG tablet ?Take 100 mg by mouth daily. ?  ?Vitamin D3 50 MCG (2000 UT) Tabs ?Take by mouth. ?  ?zinc gluconate 50 MG tablet ?Take 50 mg by mouth daily. ?  ? ?  ? ? ? ?  Objective:  ? ?BP (!) 185/79   Pulse 88   Ht _0  (1.549 m)   Wt 144 lb (65.3 kg)   SpO2 98%   BMI 27.21 kg/m?   ?Wt Readings from Last 3 Encounters:  ?10/20/21 144 lb (65.3 kg)  ?06/04/21 145 lb (65.8 kg)  ?04/21/21 145 lb (65.8 kg)  ?  ?Physical Exam ?Vitals and nursing note reviewed.  ?Constitutional:   ?   General: She is not in acute distress. ?   Appearance: She is well-developed. She is not diaphoretic.  ?Eyes:  ?   Conjunctiva/sclera: Conjunctivae normal.  ?Cardiovascular:  ?   Rate and Rhythm: Normal rate and regular rhythm.  ?   Heart sounds: Normal  heart sounds. No murmur heard. ?Pulmonary:  ?   Effort: Pulmonary effort is normal. No respiratory distress.  ?   Breath sounds: Normal breath sounds. No wheezing.  ?Musculoskeletal:     ?   General: No swelling or tenderness. Normal range of motion.  ?Skin: ?   General: Skin is warm and dry.  ?   Findings: No rash.  ?Neurological:  ?   Mental Status: She is alert and oriented to person, place, and time.  ?   Coordination: Coordination normal.  ?Psychiatric:     ?   Behavior: Behavior normal.  ? ? ? ? ?Assessment & Plan:  ? ?Problem List Items Addressed This Visit   ? ?  ? Cardiovascular and Mediastinum  ? Primary hypertension  ? Relevant Medications  ? hydrochlorothiazide (HYDRODIURIL) 25 MG tablet  ? Other Relevant Orders  ? CBC with Differential/Platelet  ? CMP14+EGFR  ? Lipid panel  ?  ? Endocrine  ? Hypothyroidism  ? Relevant Medications  ? levothyroxine (SYNTHROID) 88 MCG tablet  ? Other Relevant Orders  ? TSH  ?  ? Other  ? Dyslipidemia - Primary  ? Relevant Orders  ? CBC with Differential/Platelet  ? CMP14+EGFR  ? Lipid panel  ? ?Other Visit Diagnoses   ? ? Postmenopausal      ? Relevant Orders  ? DG [facility] DEXA  ? ?  ?  ?Blood pressure looks good at home, will follow with home measurements, likely has whitecoat hypertension. ? ?Patient will get bone density today but declines all other screening vaccinations and exams. ? ? ?Follow up plan: ?Return in about 6 months (around 04/21/2022), or if symptoms worsen or fail to improve, for Hypertension and cholesterol. ? ?Counseling provided for all of the vaccine components ?Orders Placed This Encounter  ?Procedures  ? DG [facility] DEXA  ? CBC with Differential/Platelet  ? CMP14+EGFR  ? Lipid panel  ? TSH  ? ? ?Caryl Pina, MD ?[place] ?10/20/2021, 9:08 AM ? ? ? ? ?

## 2021-10-21 LAB — CMP14+EGFR
ALT: 34 IU/L — ABNORMAL HIGH (ref 0–32)
AST: 30 IU/L (ref 0–40)
Albumin/Globulin Ratio: 1.4 (ref 1.2–2.2)
Albumin: 4.6 g/dL (ref 3.8–4.8)
Alkaline Phosphatase: 92 IU/L (ref 44–121)
BUN/Creatinine Ratio: 14 (ref 12–28)
BUN: 11 mg/dL (ref 8–27)
Bilirubin Total: 0.5 mg/dL (ref 0.0–1.2)
CO2: 25 mmol/L (ref 20–29)
Calcium: 9.9 mg/dL (ref 8.7–10.3)
Chloride: 95 mmol/L — ABNORMAL LOW (ref 96–106)
Creatinine, Ser: 0.78 mg/dL (ref 0.57–1.00)
Globulin, Total: 3.2 g/dL (ref 1.5–4.5)
Glucose: 112 mg/dL — ABNORMAL HIGH (ref 70–99)
Potassium: 4.7 mmol/L (ref 3.5–5.2)
Sodium: 135 mmol/L (ref 134–144)
Total Protein: 7.8 g/dL (ref 6.0–8.5)
eGFR: 82 mL/min/{1.73_m2} (ref >59)

## 2021-10-21 LAB — CBC WITH DIFFERENTIAL/PLATELET
Basophils Absolute: 0 10*3/uL (ref 0.0–0.2)
Basos: 1 %
EOS (ABSOLUTE): 0.1 10*3/uL (ref 0.0–0.4)
Eos: 1 %
Hematocrit: 36.3 % (ref 34.0–46.6)
Hemoglobin: 11.5 g/dL (ref 11.1–15.9)
Immature Grans (Abs): 0 10*3/uL (ref 0.0–0.1)
Immature Granulocytes: 0 %
Lymphocytes Absolute: 2 10*3/uL (ref 0.7–3.1)
Lymphs: 24 %
MCH: 25.4 pg — ABNORMAL LOW (ref 26.6–33.0)
MCHC: 31.7 g/dL (ref 31.5–35.7)
MCV: 80 fL (ref 79–97)
Monocytes Absolute: 0.5 10*3/uL (ref 0.1–0.9)
Monocytes: 6 %
Neutrophils Absolute: 5.6 10*3/uL (ref 1.4–7.0)
Neutrophils: 68 %
Platelets: 286 10*3/uL (ref 150–450)
RBC: 4.53 x10E6/uL (ref 3.77–5.28)
RDW: 14.4 % (ref 11.7–15.4)
WBC: 8.2 10*3/uL (ref 3.4–10.8)

## 2021-10-21 LAB — LIPID PANEL
Chol/HDL Ratio: 4.2 ratio (ref 0.0–4.4)
Cholesterol, Total: 199 mg/dL (ref 100–199)
HDL: 47 mg/dL (ref >39)
LDL Chol Calc (NIH): 125 mg/dL — ABNORMAL HIGH (ref 0–99)
Triglycerides: 153 mg/dL — ABNORMAL HIGH (ref 0–149)
VLDL Cholesterol Cal: 27 mg/dL (ref 5–40)

## 2021-10-21 LAB — TSH: TSH: 5.87 u[IU]/mL — ABNORMAL HIGH (ref 0.450–4.500)

## 2021-10-27 ENCOUNTER — Other Ambulatory Visit: Payer: Self-pay

## 2021-10-27 DIAGNOSIS — E039 Hypothyroidism, unspecified: Secondary | ICD-10-CM

## 2021-10-27 MED ORDER — LEVOTHYROXINE SODIUM 100 MCG PO TABS: 100.0000 ug | ORAL_TABLET | Freq: Every day | ORAL | 1 refills | Status: DC

## 2022-01-23 ENCOUNTER — Other Ambulatory Visit: Payer: PPO

## 2022-01-23 DIAGNOSIS — E039 Hypothyroidism, unspecified: Secondary | ICD-10-CM

## 2022-01-24 LAB — TSH: TSH: 0.47 u[IU]/mL (ref 0.450–4.500)

## 2022-04-23 ENCOUNTER — Encounter: Payer: Self-pay | Admitting: Family Medicine

## 2022-04-23 ENCOUNTER — Ambulatory Visit (INDEPENDENT_AMBULATORY_CARE_PROVIDER_SITE_OTHER): Payer: PPO | Admitting: Family Medicine

## 2022-04-23 VITALS — BP 143/75 | HR 84 | Temp 97.2°F | Ht 61.0 in | Wt 142.0 lb

## 2022-04-23 DIAGNOSIS — E785 Hyperlipidemia, unspecified: Secondary | ICD-10-CM | POA: Diagnosis not present

## 2022-04-23 DIAGNOSIS — M8589 Other specified disorders of bone density and structure, multiple sites: Secondary | ICD-10-CM | POA: Diagnosis not present

## 2022-04-23 DIAGNOSIS — E039 Hypothyroidism, unspecified: Secondary | ICD-10-CM

## 2022-04-23 DIAGNOSIS — I10 Essential (primary) hypertension: Secondary | ICD-10-CM | POA: Diagnosis not present

## 2022-04-23 MED ORDER — LEVOTHYROXINE SODIUM 100 MCG PO TABS: 100.0000 ug | ORAL_TABLET | Freq: Every day | ORAL | 1 refills | Status: DC

## 2022-04-23 MED ORDER — HYDROCHLOROTHIAZIDE 25 MG PO TABS: 25.0000 mg | ORAL_TABLET | Freq: Every day | ORAL | 1 refills | Status: DC

## 2022-04-23 NOTE — Progress Notes (Signed)
BP (!) 143/75   Pulse 84   Temp (!) 97.2 F (36.2 C)   Ht 5' 1" (1.549 m)   Wt 142 lb (64.4 kg)   SpO2 98%   BMI 26.83 kg/m    Subjective:   Patient ID: Margaret Stevens, female    DOB: Nov 28, 1950, 71 y.o.   MRN: [id number]  HPI: Margaret Stevens is a 71 y.o. female presenting on 04/23/2022 for Medical Management of Chronic Issues, Hypertension, Hyperlipidemia, and Hypothyroidism   HPI Hypertension Patient is currently on hydrochlorothiazide, and their blood pressure today is 143/75 and runs better at Home. Patient denies any lightheadedness or dizziness. Patient denies headaches, blurred vision, chest pains, shortness of breath, or weakness. Denies any side effects from medication and is content with current medication.   Hypothyroidism recheck Patient is coming in for thyroid recheck today as well. They deny any issues with hair changes or heat or cold problems or diarrhea or constipation. They deny any chest pain or palpitations. They are currently on levothyroxine 100 micrograms   Hyperlipidemia Patient is coming in for recheck of his hyperlipidemia. The patient is currently taking Krill oil/fish oil. They deny any issues with myalgias or history of liver damage from it. They deny any focal numbness or weakness or chest pain.   Osteoporosis/osteopenia Fractures or history of fracture: None Medication: Calcium and vitamin D Duration of treatment: 2 years Last bone density scan: 6 months ago Last T score: -2.4  Relevant past medical, surgical, family and social history reviewed and updated as indicated. Interim medical history since our last visit reviewed. Allergies and medications reviewed and updated.  Review of Systems  Constitutional:  Negative for chills and fever.  Eyes:  Negative for visual disturbance.  Respiratory:  Negative for chest tightness and shortness of breath.   Cardiovascular:  Negative for chest pain and leg swelling.  Musculoskeletal:  Negative for  back pain and gait problem.  Skin:  Negative for rash.  Neurological:  Negative for dizziness, light-headedness and headaches.  Psychiatric/Behavioral:  Negative for agitation and behavioral problems.   All other systems reviewed and are negative.   Per HPI unless specifically indicated above   Allergies as of 04/23/2022   No Known Allergies      Medication List        Accurate as of April 23, 2022  8:21 AM. If you have any questions, ask your nurse or doctor.          STOP taking these medications    loratadine 10 MG tablet Commonly known as: CLARITIN Stopped by: Fransisca Kaufmann Adeleine Pask, MD       TAKE these medications    BinaxNOW COVID-19 Ag Home Test Kit Generic drug: COVID-19 At Home Antigen Test Use as Directed on the Package   cetirizine 10 MG tablet Commonly known as: ZYRTEC Take 10 mg by mouth daily.   Cinnamon 500 MG capsule Take 500 mg by mouth daily.   docusate sodium 100 MG capsule Commonly known as: COLACE Take 100 mg by mouth daily.   Fluzone High-Dose Quadrivalent 0.7 ML Susy Generic drug: Influenza Vac High-Dose Quad   GINGER ROOT PO Take 1 Dose by mouth daily.   HAWTHORN BERRIES PO Take by mouth. With grape seed extract   hydrochlorothiazide 25 MG tablet Commonly known as: HYDRODIURIL Take 1 tablet (25 mg total) by mouth daily.   KRILL OIL PLUS PO Take by mouth 2 (two) times daily.   levothyroxine 100 MCG tablet Commonly  known as: SYNTHROID Take 1 tablet (100 mcg total) by mouth daily.   PROBIOTIC DAILY PO Take by mouth daily.   QC Tumeric Complex 500 MG Caps Generic drug: Turmeric Take 1 Capful by mouth daily.   vitamin C 100 MG tablet Take 100 mg by mouth daily.   Vitamin D3 50 MCG (2000 UT) Tabs Take by mouth.   zinc gluconate 50 MG tablet Take 50 mg by mouth daily.         Objective:   BP (!) 143/75   Pulse 84   Temp (!) 97.2 F (36.2 C)   Ht 5' 1" (1.549 m)   Wt 142 lb (64.4 kg)   SpO2 98%   BMI  26.83 kg/m   Wt Readings from Last 3 Encounters:  04/23/22 142 lb (64.4 kg)  10/20/21 144 lb (65.3 kg)  06/04/21 145 lb (65.8 kg)    Physical Exam Vitals and nursing note reviewed.  Constitutional:      General: She is not in acute distress.    Appearance: She is well-developed. She is not diaphoretic.  Eyes:     Conjunctiva/sclera: Conjunctivae normal.  Cardiovascular:     Rate and Rhythm: Normal rate and regular rhythm.     Heart sounds: Normal heart sounds. No murmur heard. Pulmonary:     Effort: Pulmonary effort is normal. No respiratory distress.     Breath sounds: Normal breath sounds. No wheezing.  Musculoskeletal:        General: No swelling or tenderness. Normal range of motion.  Skin:    General: Skin is warm and dry.     Findings: No rash.  Neurological:     Mental Status: She is alert and oriented to person, place, and time.     Coordination: Coordination normal.  Psychiatric:        Behavior: Behavior normal.       Assessment & Plan:   Problem List Items Addressed This Visit       Cardiovascular and Mediastinum   Primary hypertension   Relevant Medications   hydrochlorothiazide (HYDRODIURIL) 25 MG tablet   Other Relevant Orders   CBC with Differential/Platelet   CMP14+EGFR   Lipid panel   TSH     Endocrine   Hypothyroidism - Primary   Relevant Medications   levothyroxine (SYNTHROID) 100 MCG tablet   Other Relevant Orders   CBC with Differential/Platelet   CMP14+EGFR   Lipid panel   TSH     Other   Dyslipidemia   Relevant Orders   CBC with Differential/Platelet   CMP14+EGFR   Lipid panel   TSH   Other Visit Diagnoses     Osteopenia of multiple sites           We will check blood work, continue current medicine, blood pressure seems to be decent.  Discussed osteopenia and calcium vitamin D and patient wants to continue on that and not do medicine for it at this point Follow up plan: Return in about 6 months (around 10/23/2022),  or if symptoms worsen or fail to improve, for Physical and hypertension cholesterol and thyroid recheck.  Counseling provided for all of the vaccine components Orders Placed This Encounter  Procedures   CBC with Differential/Platelet   CMP14+EGFR   Lipid panel   TSH    Caryl Pina, MD [facility] 04/23/2022, 8:21 AM

## 2022-04-23 NOTE — Patient Instructions (Signed)
Suggest 800 international units of vitamin D daily and 1200 mg elemental calcium for osteopenia or osteoporosis.   

## 2022-04-24 LAB — LIPID PANEL
Chol/HDL Ratio: 3.5 ratio (ref 0.0–4.4)
Cholesterol, Total: 171 mg/dL (ref 100–199)
HDL: 49 mg/dL (ref >39)
LDL Chol Calc (NIH): 102 mg/dL — ABNORMAL HIGH (ref 0–99)
Triglycerides: 110 mg/dL (ref 0–149)
VLDL Cholesterol Cal: 20 mg/dL (ref 5–40)

## 2022-04-24 LAB — CMP14+EGFR
ALT: 45 IU/L — ABNORMAL HIGH (ref 0–32)
AST: 39 IU/L (ref 0–40)
Albumin/Globulin Ratio: 1.3 (ref 1.2–2.2)
Albumin: 4.2 g/dL (ref 3.8–4.8)
Alkaline Phosphatase: 92 IU/L (ref 44–121)
BUN/Creatinine Ratio: 20 (ref 12–28)
BUN: 17 mg/dL (ref 8–27)
Bilirubin Total: 0.4 mg/dL (ref 0.0–1.2)
CO2: 24 mmol/L (ref 20–29)
Calcium: 9.5 mg/dL (ref 8.7–10.3)
Chloride: 95 mmol/L — ABNORMAL LOW (ref 96–106)
Creatinine, Ser: 0.83 mg/dL (ref 0.57–1.00)
Globulin, Total: 3.2 g/dL (ref 1.5–4.5)
Glucose: 122 mg/dL — ABNORMAL HIGH (ref 70–99)
Potassium: 4.3 mmol/L (ref 3.5–5.2)
Sodium: 135 mmol/L (ref 134–144)
Total Protein: 7.4 g/dL (ref 6.0–8.5)
eGFR: 75 mL/min/{1.73_m2} (ref >59)

## 2022-04-24 LAB — CBC WITH DIFFERENTIAL/PLATELET
Basophils Absolute: 0 10*3/uL (ref 0.0–0.2)
Basos: 1 %
EOS (ABSOLUTE): 0.2 10*3/uL (ref 0.0–0.4)
Eos: 3 %
Hematocrit: 35.1 % (ref 34.0–46.6)
Hemoglobin: 10.9 g/dL — ABNORMAL LOW (ref 11.1–15.9)
Immature Grans (Abs): 0 10*3/uL (ref 0.0–0.1)
Immature Granulocytes: 0 %
Lymphocytes Absolute: 2.6 10*3/uL (ref 0.7–3.1)
Lymphs: 32 %
MCH: 24 pg — ABNORMAL LOW (ref 26.6–33.0)
MCHC: 31.1 g/dL — ABNORMAL LOW (ref 31.5–35.7)
MCV: 77 fL — ABNORMAL LOW (ref 79–97)
Monocytes Absolute: 0.6 10*3/uL (ref 0.1–0.9)
Monocytes: 7 %
Neutrophils Absolute: 4.7 10*3/uL (ref 1.4–7.0)
Neutrophils: 57 %
Platelets: 295 10*3/uL (ref 150–450)
RBC: 4.54 x10E6/uL (ref 3.77–5.28)
RDW: 15.1 % (ref 11.7–15.4)
WBC: 8.2 10*3/uL (ref 3.4–10.8)

## 2022-04-24 LAB — TSH: TSH: 1.45 u[IU]/mL (ref 0.450–4.500)

## 2022-06-08 ENCOUNTER — Ambulatory Visit (INDEPENDENT_AMBULATORY_CARE_PROVIDER_SITE_OTHER): Payer: PPO

## 2022-06-08 VITALS — Ht 60.0 in | Wt 143.0 lb

## 2022-06-08 DIAGNOSIS — Z0000 Encounter for general adult medical examination without abnormal findings: Secondary | ICD-10-CM

## 2022-06-08 DIAGNOSIS — Z1231 Encounter for screening mammogram for malignant neoplasm of breast: Secondary | ICD-10-CM | POA: Diagnosis not present

## 2022-06-08 NOTE — Patient Instructions (Signed)
Margaret Stevens , Thank you for taking time to come for your Medicare Wellness Visit. I appreciate your ongoing commitment to your health goals. Please review the following plan we discussed and let me know if I can assist you in the future.   These are the goals we discussed:  Goals      DIET - REDUCE CALORIE INTAKE     Would like to lose weight, ride exercise bike more and do hobbies more.         This is a list of the screening recommended for you and due dates:  Health Maintenance  Topic Date Due   DTaP/Tdap/Td vaccine (1 - Tdap) Never done   COVID-19 Vaccine (4 - 2023-24 season) 03/06/2022   Pneumonia Vaccine (1 - PCV) 10/21/2022*   Mammogram  10/21/2022*   Colon Cancer Screening  10/21/2022*   Zoster (Shingles) Vaccine (1 of 2) 01/18/2023*   Medicare Annual Wellness Visit  06/09/2023   Flu Shot  Completed   DEXA scan (bone density measurement)  Completed   Hepatitis C Screening: USPSTF Recommendation to screen - Ages 18-79 yo.  Completed   HPV Vaccine  Aged Out  *Topic was postponed. The date shown is not the original due date.    Advanced directives: Advance directive discussed with you today. I have provided a copy for you to complete at home and have notarized. Once this is complete please bring a copy in to our office so we can scan it into your chart.   Conditions/risks identified: Aim for 30 minutes of exercise or brisk walking, 6-8 glasses of water, and 5 servings of fruits and vegetables each day.   Next appointment: Follow up in one year for your annual wellness visit    Preventive Care 65 Years and Older, Female Preventive care refers to lifestyle choices and visits with your health care provider that can promote health and wellness. What does preventive care include? A yearly physical exam. This is also called an annual well check. Dental exams once or twice a year. Routine eye exams. Ask your health care provider how often you should have your eyes  checked. Personal lifestyle choices, including: Daily care of your teeth and gums. Regular physical activity. Eating a healthy diet. Avoiding tobacco and drug use. Limiting alcohol use. Practicing safe sex. Taking low-dose aspirin every day. Taking vitamin and mineral supplements as recommended by your health care provider. What happens during an annual well check? The services and screenings done by your health care provider during your annual well check will depend on your age, overall health, lifestyle risk factors, and family history of disease. Counseling  Your health care provider may ask you questions about your: Alcohol use. Tobacco use. Drug use. Emotional well-being. Home and relationship well-being. Sexual activity. Eating habits. History of falls. Memory and ability to understand (cognition). Work and work Astronomer. Reproductive health. Screening  You may have the following tests or measurements: Height, weight, and BMI. Blood pressure. Lipid and cholesterol levels. These may be checked every 5 years, or more frequently if you are over 46 years old. Skin check. Lung cancer screening. You may have this screening every year starting at age 69 if you have a 30-pack-year history of smoking and currently smoke or have quit within the past 15 years. Fecal occult blood test (FOBT) of the stool. You may have this test every year starting at age 58. Flexible sigmoidoscopy or colonoscopy. You may have a sigmoidoscopy every 5 years or a colonoscopy  every 10 years starting at age 26. Hepatitis C blood test. Hepatitis B blood test. Sexually transmitted disease (STD) testing. Diabetes screening. This is done by checking your blood sugar (glucose) after you have not eaten for a while (fasting). You may have this done every 1-3 years. Bone density scan. This is done to screen for osteoporosis. You may have this done starting at age 74. Mammogram. This may be done every 1-2  years. Talk to your health care provider about how often you should have regular mammograms. Talk with your health care provider about your test results, treatment options, and if necessary, the need for more tests. Vaccines  Your health care provider may recommend certain vaccines, such as: Influenza vaccine. This is recommended every year. Tetanus, diphtheria, and acellular pertussis (Tdap, Td) vaccine. You may need a Td booster every 10 years. Zoster vaccine. You may need this after age 64. Pneumococcal 13-valent conjugate (PCV13) vaccine. One dose is recommended after age 54. Pneumococcal polysaccharide (PPSV23) vaccine. One dose is recommended after age 75. Talk to your health care provider about which screenings and vaccines you need and how often you need them. This information is not intended to replace advice given to you by your health care provider. Make sure you discuss any questions you have with your health care provider. Document Released: 07/19/2015 Document Revised: 03/11/2016 Document Reviewed: 04/23/2015 Elsevier Interactive Patient Education  2017 [place] Prevention in the Home Falls can cause injuries. They can happen to people of all ages. There are many things you can do to make your home safe and to help prevent falls. What can I do on the outside of my home? Regularly fix the edges of walkways and driveways and fix any cracks. Remove anything that might make you trip as you walk through a door, such as a raised step or threshold. Trim any bushes or trees on the path to your home. Use bright outdoor lighting. Clear any walking paths of anything that might make someone trip, such as rocks or tools. Regularly check to see if handrails are loose or broken. Make sure that both sides of any steps have handrails. Any raised decks and porches should have guardrails on the edges. Have any leaves, snow, or ice cleared regularly. Use sand or salt on walking paths  during winter. Clean up any spills in your garage right away. This includes oil or grease spills. What can I do in the bathroom? Use night lights. Install grab bars by the toilet and in the tub and shower. Do not use towel bars as grab bars. Use non-skid mats or decals in the tub or shower. If you need to sit down in the shower, use a plastic, non-slip stool. Keep the floor dry. Clean up any water that spills on the floor as soon as it happens. Remove soap buildup in the tub or shower regularly. Attach bath mats securely with double-sided non-slip rug tape. Do not have throw rugs and other things on the floor that can make you trip. What can I do in the bedroom? Use night lights. Make sure that you have a light by your bed that is easy to reach. Do not use any sheets or blankets that are too big for your bed. They should not hang down onto the floor. Have a firm chair that has side arms. You can use this for support while you get dressed. Do not have throw rugs and other things on the floor that can make  you trip. What can I do in the kitchen? Clean up any spills right away. Avoid walking on wet floors. Keep items that you use a lot in easy-to-reach places. If you need to reach something above you, use a strong step stool that has a grab bar. Keep electrical cords out of the way. Do not use floor polish or wax that makes floors slippery. If you must use wax, use non-skid floor wax. Do not have throw rugs and other things on the floor that can make you trip. What can I do with my stairs? Do not leave any items on the stairs. Make sure that there are handrails on both sides of the stairs and use them. Fix handrails that are broken or loose. Make sure that handrails are as long as the stairways. Check any carpeting to make sure that it is firmly attached to the stairs. Fix any carpet that is loose or worn. Avoid having throw rugs at the top or bottom of the stairs. If you do have throw  rugs, attach them to the floor with carpet tape. Make sure that you have a light switch at the top of the stairs and the bottom of the stairs. If you do not have them, ask someone to add them for you. What else can I do to help prevent falls? Wear shoes that: Do not have high heels. Have rubber bottoms. Are comfortable and fit you well. Are closed at the toe. Do not wear sandals. If you use a stepladder: Make sure that it is fully opened. Do not climb a closed stepladder. Make sure that both sides of the stepladder are locked into place. Ask someone to hold it for you, if possible. Clearly mark and make sure that you can see: Any grab bars or handrails. First and last steps. Where the edge of each step is. Use tools that help you move around (mobility aids) if they are needed. These include: Canes. Walkers. Scooters. Crutches. Turn on the lights when you go into a dark area. Replace any light bulbs as soon as they burn out. Set up your furniture so you have a clear path. Avoid moving your furniture around. If any of your floors are uneven, fix them. If there are any pets around you, be aware of where they are. Review your medicines with your doctor. Some medicines can make you feel dizzy. This can increase your chance of falling. Ask your doctor what other things that you can do to help prevent falls. This information is not intended to replace advice given to you by your health care provider. Make sure you discuss any questions you have with your health care provider. Document Released: 04/18/2009 Document Revised: 11/28/2015 Document Reviewed: 07/27/2014 Elsevier Interactive Patient Education  2017 [employer].

## 2022-06-08 NOTE — Progress Notes (Signed)
Subjective:   Margaret Stevens is a 71 y.o. female who presents for Medicare Annual (Subsequent) preventive examination. I connected with  Dehlia Kilner Deeds on 06/08/22 by a audio enabled telemedicine application and verified that I am speaking with the correct person using two identifiers.  Patient Location: Home  Provider Location: Home Office  I discussed the limitations of evaluation and management by telemedicine. The patient expressed understanding and agreed to proceed.  Review of Systems     Cardiac Risk Factors include: advanced age (>59mn, >>62women);hypertension     Objective:    Today's Vitals   06/08/22 1458  Weight: 143 lb (64.9 kg)  Height: 5' (1.524 m)   Body mass index is 27.93 kg/m.     06/04/2021    2:42 PM  Advanced Directives  Does Patient Have a Medical Advance Directive? No  Would patient like information on creating a medical advance directive? No - Patient declined    Current Medications (verified) Outpatient Encounter Medications as of 06/08/2022  Medication Sig   Ascorbic Acid (VITAMIN C) 100 MG tablet Take 100 mg by mouth daily.   BINAXNOW COVID-19 AG HOME TEST KIT Use as Directed on the Package   cetirizine (ZYRTEC) 10 MG tablet Take 10 mg by mouth daily.   Cholecalciferol (VITAMIN D3) 2000 units TABS Take by mouth.   Cinnamon 500 MG capsule Take 500 mg by mouth daily.   docusate sodium (COLACE) 100 MG capsule Take 100 mg by mouth daily.   Fish Oil-Krill Oil (KRILL OIL PLUS PO) Take by mouth 2 (two) times daily.    FLUZONE HIGH-DOSE QUADRIVALENT 0.7 ML SUSY    Ginger, Zingiber officinalis, (GINGER ROOT PO) Take 1 Dose by mouth daily.   HAWTHORN BERRIES PO Take by mouth. With grape seed extract   hydrochlorothiazide (HYDRODIURIL) 25 MG tablet Take 1 tablet (25 mg total) by mouth daily.   levothyroxine (SYNTHROID) 100 MCG tablet Take 1 tablet (100 mcg total) by mouth daily.   Probiotic Product (PROBIOTIC DAILY PO) Take by mouth daily.    Turmeric (QC TUMERIC COMPLEX) 500 MG CAPS Take 1 Capful by mouth daily.   zinc gluconate 50 MG tablet Take 50 mg by mouth daily.   No facility-administered encounter medications on file as of 06/08/2022.    Allergies (verified) Patient has no known allergies.   History: Past Medical History:  Diagnosis Date   Hypertension 04/21/2021   Thyroid disease    History reviewed. No pertinent surgical history. Family History  Problem Relation Age of Onset   Hypertension Mother    Heart disease Father    Arthritis Father    COPD Father    Learning disabilities Father    Hypertension Father    Diabetes Brother    Learning disabilities Brother    Cancer Maternal Grandmother        skin   Depression Maternal Grandmother    Stroke Maternal Grandmother    Mental illness Maternal Grandmother    Hypertension Maternal Grandmother    Hypertension Maternal Grandfather    Heart disease Paternal Grandmother    Heart disease Paternal Grandfather    Social History   Socioeconomic History   Marital status: Married    Spouse name: AArnell Sieving  Number of children: 1   Years of education: Not on file   Highest education level: Not on file  Occupational History   Not on file  Tobacco Use   Smoking status: Never   Smokeless tobacco: Never  Vaping Use   Vaping Use: Never used  Substance and Sexual Activity   Alcohol use: No   Drug use: No   Sexual activity: Yes    Birth control/protection: None    Comment: married 53 years  Other Topics Concern   Not on file  Social History Narrative   ** Merged History Encounter **       Married x 48 years in 2022. 1 son 2 granchildren   Social Determinants of Health   Financial Resource Strain: Low Risk  (06/08/2022)   Overall Financial Resource Strain (CARDIA)    Difficulty of Paying Living Expenses: Not hard at all  Food Insecurity: No Food Insecurity (06/08/2022)   Hunger Vital Sign    Worried About Running Out of Food in the Last Year: Never  true    [place] in the Last Year: Never true  Transportation Needs: No Transportation Needs (06/08/2022)   PRAPARE - Hydrologist (Medical): No    Lack of Transportation (Non-Medical): No  Physical Activity: Insufficiently Active (06/08/2022)   Exercise Vital Sign    Days of Exercise per Week: 3 days    Minutes of Exercise per Session: 30 min  Stress: No Stress Concern Present (06/08/2022)   [place]    Feeling of Stress : Not at all  Social Connections: Moderately Integrated (06/08/2022)   Social Connection and Isolation Panel [NHANES]    Frequency of Communication with Friends and Family: More than three times a week    Frequency of Social Gatherings with Friends and Family: More than three times a week    Attends Religious Services: More than 4 times per year    Active Member of [employer] or Organizations: No    Attends Music therapist: Never    Marital Status: Married    Tobacco Counseling Counseling given: Not Answered   Clinical Intake:  Pre-visit preparation completed: Yes  Pain : No/denies pain     Nutritional Risks: None Diabetes: No  How often do you need to have someone help you when you read instructions, pamphlets, or other written materials from your doctor or pharmacy?: 1 - Never  Diabetic?no  Interpreter Needed?: No  Information entered by :: Jadene Pierini, LPN   Activities of Daily Living    06/08/2022    3:01 PM  In your present state of health, do you have any difficulty performing the following activities:  Hearing? 0  Vision? 0  Difficulty concentrating or making decisions? 0  Walking or climbing stairs? 0  Dressing or bathing? 0  Doing errands, shopping? 0  Preparing Food and eating ? N  Using the Toilet? N  In the past six months, have you accidently leaked urine? N  Do you have problems with loss of bowel control? N  Managing  your Medications? N  Managing your Finances? N  Housekeeping or managing your Housekeeping? N    Patient Care Team: Dettinger, Fransisca Kaufmann, MD as PCP - General (Family Medicine)  Indicate any recent Medical Services you may have received from other than [facility] providers in the past year (date may be approximate).     Assessment:   This is a routine wellness examination for [facility].  Hearing/Vision screen Vision Screening - Comments:: Patient to schedule Dr.Johnson   Dietary issues and exercise activities discussed: Current Exercise Habits: Home exercise routine, Type of exercise: walking, Time (Minutes): 30, Frequency (Times/Week): 3, Weekly  Exercise (Minutes/Week): 90, Intensity: Mild, Exercise limited by: None identified   Goals Addressed             This Visit's Progress    DIET - REDUCE CALORIE INTAKE   On track    Would like to lose weight, ride exercise bike more and do hobbies more.        Depression Screen    06/08/2022    3:00 PM 04/23/2022    8:08 AM 10/20/2021    8:47 AM 06/04/2021    2:39 PM 04/21/2021   11:03 AM 01/01/2021    1:09 PM 09/27/2020   11:06 AM  PHQ 2/9 Scores  PHQ - 2 Score 0 0 0 0 0 0 0    Fall Risk    06/08/2022    2:59 PM 04/23/2022    8:08 AM 10/20/2021    8:47 AM 06/04/2021    2:45 PM 04/21/2021   11:03 AM  Fall Risk   Falls in the past year? 0 0 0 0 0  Number falls in past yr: 0   0   Injury with Fall? 0   0   Risk for fall due to : No Fall Risks   No Fall Risks   Follow up Falls prevention discussed   Falls prevention discussed     FALL RISK PREVENTION PERTAINING TO THE HOME:  Any stairs in or around the home? Yes  If so, are there any without handrails? No  Home free of loose throw rugs in walkways, pet beds, electrical cords, etc? Yes  Adequate lighting in your home to reduce risk of falls? Yes   ASSISTIVE DEVICES UTILIZED TO PREVENT FALLS:  Life alert? No  Use of a cane, walker or w/c? No  Grab bars in the bathroom? Yes   Shower chair or bench in shower? Yes  Elevated toilet seat or a handicapped toilet? Yes          06/08/2022    3:01 PM 06/04/2021    2:48 PM  6CIT Screen  What Year? 0 points 0 points  What month? 0 points 0 points  What time? 0 points 0 points  Count back from 20 0 points 0 points  Months in reverse 0 points 0 points  Repeat phrase 0 points 0 points  Total Score 0 points 0 points    Immunizations Immunization History  Administered Date(s) Administered   Fluad Quad(high Dose 65+) 06/20/2020, 04/17/2022   Influenza, High Dose Seasonal PF 05/20/2018, 05/27/2019   Influenza-Unspecified 05/20/2018, 04/29/2021   Janssen (J&J) SARS-COV-2 Vaccination 10/16/2019   Moderna Covid-19 Vaccine Bivalent Booster 65yr & up 05/15/2021   PFIZER(Purple Top)SARS-COV-2 Vaccination 08/09/2020    TDAP status: Due, Education has been provided regarding the importance of this vaccine. Advised may receive this vaccine at local pharmacy or Health Dept. Aware to provide a copy of the vaccination record if obtained from local pharmacy or Health Dept. Verbalized acceptance and understanding.  Flu Vaccine status: Up to date  Pneumococcal vaccine status: Due, Education has been provided regarding the importance of this vaccine. Advised may receive this vaccine at local pharmacy or Health Dept. Aware to provide a copy of the vaccination record if obtained from local pharmacy or Health Dept. Verbalized acceptance and understanding.  Covid-19 vaccine status: Completed vaccines  Qualifies for Shingles Vaccine? Yes   Zostavax completed No   Shingrix Completed?: No.    Education has been provided regarding the importance of this vaccine. Patient has been advised to  call insurance company to determine out of pocket expense if they have not yet received this vaccine. Advised may also receive vaccine at local pharmacy or Health Dept. Verbalized acceptance and understanding.  Screening Tests Health Maintenance   Topic Date Due   DTaP/Tdap/Td (1 - Tdap) Never done   COVID-19 Vaccine (4 - 2023-24 season) 03/06/2022   Pneumonia Vaccine 17+ Years old (1 - PCV) 10/21/2022 (Originally 01/11/2016)   MAMMOGRAM  10/21/2022 (Originally 01/10/2001)   COLONOSCOPY (Pts 45-39yr Insurance coverage will need to be confirmed)  10/21/2022 (Originally 01/11/1996)   Zoster Vaccines- Shingrix (1 of 2) 01/18/2023 (Originally 01/10/2001)   Medicare Annual Wellness (AWV)  06/09/2023   INFLUENZA VACCINE  Completed   DEXA SCAN  Completed   Hepatitis C Screening  Completed   HPV VACCINES  Aged Out    Health Maintenance  Health Maintenance Due  Topic Date Due   DTaP/Tdap/Td (1 - Tdap) Never done   COVID-19 Vaccine (4 - 2023-24 season) 03/06/2022    Colorectal cancer screening: Referral to GI placed declined at this time . Pt aware the office will call re: appt.  Mammogram status: Ordered 12/0/10/2021. Pt provided with contact info and advised to call to schedule appt.   Bone Density status: Completed 10/20/2021. Results reflect: Bone density results: OSTEOPENIA. Repeat every 5 years.  Lung Cancer Screening: (Low Dose CT Chest recommended if Age 71-80years, 30 pack-year currently smoking OR have quit w/in 15years.) does not qualify.   Lung Cancer Screening Referral: n/a  Additional Screening:  Hepatitis C Screening: does not qualify;   Vision Screening: Recommended annual ophthalmology exams for early detection of glaucoma and other disorders of the eye. Is the patient up to date with their annual eye exam?  No  Who is the provider or what is the name of the office in which the patient attends annual eye exams? Dr JWynetta Emerypatient to call schedule  If pt is not established with a provider, would they like to be referred to a provider to establish care? No .   Dental Screening: Recommended annual dental exams for proper oral hygiene  Community Resource Referral / Chronic Care Management: CRR required this visit?  No    CCM required this visit?  No      Plan:     I have personally reviewed and noted the following in the patient's chart:   Medical and social history Use of alcohol, tobacco or illicit drugs  Current medications and supplements including opioid prescriptions. Patient is not currently taking opioid prescriptions. Functional ability and status Nutritional status Physical activity Advanced directives List of other physicians Hospitalizations, surgeries, and ER visits in previous 12 months Vitals Screenings to include cognitive, depression, and falls Referrals and appointments  In addition, I have reviewed and discussed with patient certain preventive protocols, quality metrics, and best practice recommendations. A written personalized care plan for preventive services as well as general preventive health recommendations were provided to patient.     LDaphane Shepherd LPN   [id number]  Nurse Notes: Patient due TDAP / Pneumonia Vaccine , Colonoscopy declined will discuss with Pcp, Referral for Mammogram

## 2022-10-22 NOTE — Patient Instructions (Signed)
Our records indicate that you are due for your annual mammogram/breast imaging. While there is no way to prevent breast cancer, early detection provides the best opportunity for curing it. For women over the age of 40, the American Cancer Society recommends a yearly clinical breast exam and a yearly mammogram. These practices have saved thousands of lives. We need your help to ensure that you are receiving optimal medical care. Please call the imaging location that has done you previous mammograms. Please remember to list us as your primary care. This helps make sure we receive a report and can update your chart.  Below is the contact information for several local breast imaging centers. You may call the location that works best for you, and they will be happy to assistance in making you an appointment. You do not need an order for a regular screening mammogram. However, if you are having any problems or concerns with you breast area, please let your primary care provider know, and appropriate orders will be placed. Please let our office know if you have any questions or concerns. Or if you need information for another imaging center not on this list or outside of the area. We are commented to working with you on your health care journey.   The mobile unit/bus ([facility]) - they come twice a month to our location.  These appointments can be made through our office or by call The Breast Center  [facility]  [address] [place], [place] [address] Phone [phone]  [facility] Radiology Department  [address]  [place], [place] [address] [phone]  [facility]  [address] [place], [place] [address] [phone]  [facility] [place]  [address] [place] [address] [phone]  [facility]  [address], [address] [place] [address] [phone]  [facility] [place]  [address] , [place] [address] [phone]  [facility] [address] [place], [place] [address] [phone]  [facility] [address]  [address] ,  [address] [phone]  [facility] [address] [place] [address] [phone]     

## 2022-10-23 ENCOUNTER — Ambulatory Visit (INDEPENDENT_AMBULATORY_CARE_PROVIDER_SITE_OTHER): Payer: PPO | Admitting: Family Medicine

## 2022-10-23 ENCOUNTER — Encounter: Payer: Self-pay | Admitting: Family Medicine

## 2022-10-23 VITALS — BP 184/86 | HR 80 | Ht 60.0 in | Wt 143.0 lb

## 2022-10-23 DIAGNOSIS — I10 Essential (primary) hypertension: Secondary | ICD-10-CM | POA: Diagnosis not present

## 2022-10-23 DIAGNOSIS — E785 Hyperlipidemia, unspecified: Secondary | ICD-10-CM | POA: Diagnosis not present

## 2022-10-23 DIAGNOSIS — Z0000 Encounter for general adult medical examination without abnormal findings: Secondary | ICD-10-CM

## 2022-10-23 DIAGNOSIS — E039 Hypothyroidism, unspecified: Secondary | ICD-10-CM | POA: Diagnosis not present

## 2022-10-23 DIAGNOSIS — Z0001 Encounter for general adult medical examination with abnormal findings: Secondary | ICD-10-CM

## 2022-10-23 LAB — CMP14+EGFR
Albumin: 4.3 g/dL (ref 3.8–4.8)
Bilirubin Total: 0.6 mg/dL (ref 0.0–1.2)
CO2: 25 mmol/L (ref 20–29)
Chloride: 92 mmol/L — ABNORMAL LOW (ref 96–106)
Globulin, Total: 3.1 g/dL (ref 1.5–4.5)
Total Protein: 7.4 g/dL (ref 6.0–8.5)
eGFR: 80 mL/min/{1.73_m2} (ref >59)

## 2022-10-23 LAB — LIPID PANEL: Triglycerides: 125 mg/dL (ref 0–149)

## 2022-10-23 MED ORDER — HYDROCHLOROTHIAZIDE 25 MG PO TABS: 25.0000 mg | ORAL_TABLET | Freq: Every day | ORAL | 3 refills | Status: DC

## 2022-10-23 MED ORDER — LEVOTHYROXINE SODIUM 100 MCG PO TABS: 100.0000 ug | ORAL_TABLET | Freq: Every day | ORAL | 3 refills | Status: DC

## 2022-10-23 NOTE — Progress Notes (Signed)
BP (!) 184/86   Pulse 80   Ht 5' (1.524 m)   Wt 143 lb (64.9 kg)   SpO2 98%   BMI 27.93 kg/m    Subjective:   Patient ID: Margaret Stevens, female    DOB: Jun 15, 1951, 72 y.o.   MRN: [id number]  HPI: Margaret Stevens is a 72 y.o. female presenting on 10/23/2022 for Medical Management of Chronic Issues   HPI Physical exam Patient denies any chest pain, shortness of breath, headaches or vision issues, abdominal complaints, diarrhea, nausea, vomiting, or joint issues.  The only joint issues she has a little bit of occasional swelling and achiness in her left ankle especially if she stands on it for long periods of time but then over the past.  Hypothyroidism recheck Patient is coming in for thyroid recheck today as well. They deny any issues with hair changes or heat or cold problems or diarrhea or constipation. They deny any chest pain or palpitations. They are currently on levothyroxine 100 micrograms   Hyperlipidemia Patient is coming in for recheck of his hyperlipidemia. The patient is currently taking Krill oil. They deny any issues with myalgias or history of liver damage from it. They deny any focal numbness or weakness or chest pain.   Hypertension Patient is currently on hydrochlorothiazide, and their blood pressure today is 184/86 but she keeps a track on it at home and it is between 113 and 146/77-86.  She is known to get whitecoat syndrome. Patient denies any lightheadedness or dizziness. Patient denies headaches, blurred vision, chest pains, shortness of breath, or weakness. Denies any side effects from medication and is content with current medication.   Relevant past medical, surgical, family and social history reviewed and updated as indicated. Interim medical history since our last visit reviewed. Allergies and medications reviewed and updated.  Review of Systems  Constitutional:  Negative for chills and fever.  HENT:  Negative for ear pain and tinnitus.   Eyes:   Negative for pain and visual disturbance.  Respiratory:  Negative for cough, chest tightness, shortness of breath and wheezing.   Cardiovascular:  Negative for chest pain, palpitations and leg swelling.  Gastrointestinal:  Negative for abdominal pain, blood in stool, constipation and diarrhea.  Genitourinary:  Negative for difficulty urinating, dysuria and hematuria.  Musculoskeletal:  Negative for back pain, gait problem and myalgias.  Skin:  Negative for rash.  Neurological:  Negative for dizziness, weakness, light-headedness and headaches.  Psychiatric/Behavioral:  Negative for agitation, behavioral problems and suicidal ideas.   All other systems reviewed and are negative.   Per HPI unless specifically indicated above   Allergies as of 10/23/2022   No Known Allergies      Medication List        Accurate as of October 23, 2022  8:24 AM. If you have any questions, ask your nurse or doctor.          BinaxNOW COVID-19 Ag Home Test Kit Generic drug: COVID-19 At Home Antigen Test Use as Directed on the Package   Calcium 200 MG Tabs Take 1 tablet by mouth daily.   cetirizine 10 MG tablet Commonly known as: ZYRTEC Take 10 mg by mouth daily.   Cinnamon 500 MG capsule Take 500 mg by mouth daily.   docusate sodium 100 MG capsule Commonly known as: COLACE Take 100 mg by mouth daily.   ferrous sulfate 325 (65 FE) MG EC tablet Take 325 mg by mouth 3 (three) times daily with meals.  Fluzone High-Dose Quadrivalent 0.7 ML Susy Generic drug: Influenza Vac High-Dose Quad   GINGER ROOT PO Take 1 Dose by mouth daily.   HAWTHORN BERRIES PO Take by mouth. With grape seed extract   hydrochlorothiazide 25 MG tablet Commonly known as: HYDRODIURIL Take 1 tablet (25 mg total) by mouth daily.   KRILL OIL PLUS PO Take by mouth 2 (two) times daily.   levothyroxine 100 MCG tablet Commonly known as: SYNTHROID Take 1 tablet (100 mcg total) by mouth daily.   PROBIOTIC DAILY  PO Take by mouth daily.   QC Tumeric Complex 500 MG Caps Generic drug: Turmeric Take 1 Capful by mouth daily.   vitamin C 100 MG tablet Take 100 mg by mouth daily.   Vitamin D3 50 MCG (2000 UT) Tabs Take by mouth.   zinc gluconate 50 MG tablet Take 50 mg by mouth daily.         Objective:   BP (!) 184/86   Pulse 80   Ht 5' (1.524 m)   Wt 143 lb (64.9 kg)   SpO2 98%   BMI 27.93 kg/m   Wt Readings from Last 3 Encounters:  10/23/22 143 lb (64.9 kg)  06/08/22 143 lb (64.9 kg)  04/23/22 142 lb (64.4 kg)    Physical Exam Vitals and nursing note reviewed.  Constitutional:      General: She is not in acute distress.    Appearance: Normal appearance. She is well-developed. She is not diaphoretic.  HENT:     Right Ear: Tympanic membrane and ear canal normal.     Left Ear: Tympanic membrane and ear canal normal.     Mouth/Throat:     Mouth: Mucous membranes are moist.     Pharynx: Oropharynx is clear. No oropharyngeal exudate or posterior oropharyngeal erythema.  Eyes:     Conjunctiva/sclera: Conjunctivae normal.  Cardiovascular:     Rate and Rhythm: Normal rate and regular rhythm.     Heart sounds: Normal heart sounds. No murmur heard. Pulmonary:     Effort: Pulmonary effort is normal. No respiratory distress.     Breath sounds: Normal breath sounds. No wheezing or rhonchi.  Abdominal:     General: Abdomen is flat. Bowel sounds are normal. There is no distension.     Tenderness: There is no abdominal tenderness. There is no right CVA tenderness, left CVA tenderness, guarding or rebound.  Musculoskeletal:        General: No swelling or tenderness. Normal range of motion.     Cervical back: No rigidity.     Left ankle: No swelling or deformity. No tenderness. Normal range of motion. Anterior drawer test negative.     Left Achilles Tendon: No tenderness or defects. Thompson's test negative.  Lymphadenopathy:     Cervical: No cervical adenopathy.  Skin:    General:  Skin is warm and dry.     Findings: No lesion or rash.  Neurological:     Mental Status: She is alert and oriented to person, place, and time.     Coordination: Coordination normal.  Psychiatric:        Behavior: Behavior normal.       Assessment & Plan:   Problem List Items Addressed This Visit       Cardiovascular and Mediastinum   Primary hypertension   Relevant Medications   hydrochlorothiazide (HYDRODIURIL) 25 MG tablet     Endocrine   Hypothyroidism   Relevant Medications   levothyroxine (SYNTHROID) 100 MCG tablet  Other   Dyslipidemia   Other Visit Diagnoses     Physical exam    -  Primary     Patient declined pelvic and breast exam today  Patient declines colon cancer screening and tetanus and pneumonia, did say she will schedule her mammogram.  Likely arthritis in her ankle, recommended Tylenol arthritis and icing as needed and good arch support.  Continue current medicine. Blood pressure elevated today but she keeps track on it every day at home and has logs from every day and it runs good at home.  Follow up plan: Return in about 6 months (around 04/24/2023), or if symptoms worsen or fail to improve, for Hypertension and hypothyroidism.  Counseling provided for all of the vaccine components No orders of the defined types were placed in this encounter.   Arville Care, MD [facility] 10/23/2022, 8:24 AM

## 2022-10-23 NOTE — Addendum Note (Signed)
Addended by: Arville Care on: 10/23/2022 08:30 AM   Modules accepted: Orders

## 2022-10-24 LAB — CBC WITH DIFFERENTIAL/PLATELET
Basophils Absolute: 0 10*3/uL (ref 0.0–0.2)
Basos: 0 %
EOS (ABSOLUTE): 0.2 10*3/uL (ref 0.0–0.4)
Eos: 2 %
Hematocrit: 41.6 % (ref 34.0–46.6)
Hemoglobin: 13.6 g/dL (ref 11.1–15.9)
Immature Grans (Abs): 0 10*3/uL (ref 0.0–0.1)
Immature Granulocytes: 0 %
Lymphocytes Absolute: 2.1 10*3/uL (ref 0.7–3.1)
Lymphs: 30 %
MCH: 28.7 pg (ref 26.6–33.0)
MCHC: 32.7 g/dL (ref 31.5–35.7)
MCV: 88 fL (ref 79–97)
Monocytes Absolute: 0.4 10*3/uL (ref 0.1–0.9)
Monocytes: 6 %
Neutrophils Absolute: 4.4 10*3/uL (ref 1.4–7.0)
Neutrophils: 62 %
Platelets: 231 10*3/uL (ref 150–450)
RBC: 4.74 x10E6/uL (ref 3.77–5.28)
RDW: 14.1 % (ref 11.7–15.4)
WBC: 7.1 10*3/uL (ref 3.4–10.8)

## 2022-10-24 LAB — CMP14+EGFR
ALT: 36 IU/L — ABNORMAL HIGH (ref 0–32)
AST: 29 IU/L (ref 0–40)
Albumin/Globulin Ratio: 1.4 (ref 1.2–2.2)
Alkaline Phosphatase: 86 IU/L (ref 44–121)
BUN/Creatinine Ratio: 16 (ref 12–28)
BUN: 13 mg/dL (ref 8–27)
Calcium: 9.5 mg/dL (ref 8.7–10.3)
Creatinine, Ser: 0.79 mg/dL (ref 0.57–1.00)
Glucose: 124 mg/dL — ABNORMAL HIGH (ref 70–99)
Potassium: 3.8 mmol/L (ref 3.5–5.2)
Sodium: 135 mmol/L (ref 134–144)

## 2022-10-24 LAB — LIPID PANEL
Chol/HDL Ratio: 3.9 ratio (ref 0.0–4.4)
Cholesterol, Total: 181 mg/dL (ref 100–199)
HDL: 46 mg/dL (ref >39)
LDL Chol Calc (NIH): 113 mg/dL — ABNORMAL HIGH (ref 0–99)
VLDL Cholesterol Cal: 22 mg/dL (ref 5–40)

## 2022-10-24 LAB — TSH: TSH: 3.8 u[IU]/mL (ref 0.450–4.500)

## 2023-04-19 NOTE — Patient Instructions (Signed)
Our records indicate that you are due for your annual mammogram/breast imaging. While there is no way to prevent breast cancer, early detection provides the best opportunity for curing it. For women over the age of 45, the American Cancer Society recommends a yearly clinical breast exam and a yearly mammogram. These practices have saved thousands of lives. We need your help to ensure that you are receiving optimal medical care. Please call the imaging location that has done you previous mammograms. Please remember to list [place] as your primary care. This helps make sure we receive a report and can update your chart.  Below is the contact information for several local breast imaging centers. You may call the location that works best for you, and they will be happy to assistance in making you an appointment. You do not need an order for a regular screening mammogram. However, if you are having any problems or concerns with you breast area, please let your primary care provider know, and appropriate orders will be placed. Please let our office know if you have any questions or concerns. Or if you need information for another imaging center not on this list or outside of the area. We are commented to working with you on your health care journey.   The mobile unit/bus ([facility]) - they come twice a month to our location.  These appointments can be made through our office or by call The Breast Center  [facility]  [address] [place], [place] [address] Phone [phone]  [facility] Radiology Department  [address] [place], [place] [address] [phone]  [facility])  [phone] [address], [place] [address] [phone]  [facility]  [address] [place] [address] [phone]  [facility]  [address] [place] [address] [phone]  [facility] in [place]  [address] [place], [place] [address] [phone]  [facility] [address] [place], [place] [address] [phone]  [facility] [address] [place], [place] [address] [phone]  [facility] [place], [place] [address] [phone]

## 2023-04-23 ENCOUNTER — Encounter: Payer: Self-pay | Admitting: Family Medicine

## 2023-04-23 ENCOUNTER — Ambulatory Visit (INDEPENDENT_AMBULATORY_CARE_PROVIDER_SITE_OTHER): Payer: PPO | Admitting: Family Medicine

## 2023-04-23 VITALS — BP 164/77 | HR 90 | Ht 60.0 in | Wt 138.0 lb

## 2023-04-23 DIAGNOSIS — I10 Essential (primary) hypertension: Secondary | ICD-10-CM

## 2023-04-23 DIAGNOSIS — E039 Hypothyroidism, unspecified: Secondary | ICD-10-CM | POA: Diagnosis not present

## 2023-04-23 DIAGNOSIS — E785 Hyperlipidemia, unspecified: Secondary | ICD-10-CM

## 2023-04-23 NOTE — Progress Notes (Signed)
BP (!) 164/77   Pulse 90   Ht 5' (1.524 m)   Wt 138 lb (62.6 kg)   SpO2 98%   BMI 26.95 kg/m    Subjective:   Patient ID: Margaret Stevens, female    DOB: 1951-02-06, 72 y.o.   MRN: [id number]  HPI: Margaret Stevens is a 72 y.o. female presenting on 04/23/2023 for Medical Management of Chronic Issues, Hypothyroidism, and Hypertension   HPI Hypothyroidism recheck Patient is coming in for thyroid recheck today as well. They deny any issues with hair changes or heat or cold problems or diarrhea or constipation. They deny any chest pain or palpitations. They are currently on levothyroxine 100 micrograms   Hyperlipidemia Patient is coming in for recheck of his hyperlipidemia. The patient is currently taking fish oils. They deny any issues with myalgias or history of liver damage from it. They deny any focal numbness or weakness or chest pain.   Hypertension Patient is currently on hydrochlorothiazide, and their blood pressure today is 164/77 but she brings her blood pressure log register with her and she has multiple numbers in the 120s and 130s over the past 4 months.  She does not have any elevated numbers at home.. Patient denies any lightheadedness or dizziness. Patient denies headaches, blurred vision, chest pains, shortness of breath, or weakness. Denies any side effects from medication and is content with current medication.   Relevant past medical, surgical, family and social history reviewed and updated as indicated. Interim medical history since our last visit reviewed. Allergies and medications reviewed and updated.  Review of Systems  Constitutional:  Negative for chills and fever.  HENT:  Negative for congestion, ear discharge and ear pain.   Eyes:  Negative for redness and visual disturbance.  Respiratory:  Negative for chest tightness and shortness of breath.   Cardiovascular:  Negative for chest pain and leg swelling.  Genitourinary:  Negative for difficulty urinating  and dysuria.  Musculoskeletal:  Negative for back pain and gait problem.  Skin:  Negative for rash.  Neurological:  Negative for light-headedness and headaches.  Psychiatric/Behavioral:  Negative for agitation and behavioral problems.   All other systems reviewed and are negative.   Per HPI unless specifically indicated above   Allergies as of 04/23/2023   No Known Allergies      Medication List        Accurate as of April 23, 2023  8:27 AM. If you have any questions, ask your nurse or doctor.          STOP taking these medications    cetirizine 10 MG tablet Commonly known as: ZYRTEC Stopped by: Elige Radon Jennice Renegar   Cinnamon 500 MG capsule Stopped by: Elige Radon Concepcion Gillott   GINGER ROOT PO Stopped by: Elige Radon Marina Desire   HAWTHORN BERRIES PO Stopped by: Elige Radon Annaleise Burger   QC Tumeric Complex 500 MG Caps Generic drug: Turmeric Stopped by: Elige Radon Tifanny Dollens       TAKE these medications    BinaxNOW COVID-19 Ag Home Test Kit Generic drug: COVID-19 At Home Antigen Test Use as Directed on the Package   Calcium 200 MG Tabs Take 1 tablet by mouth daily.   docusate sodium 100 MG capsule Commonly known as: COLACE Take 100 mg by mouth daily.   Elderberry 500 MG Caps Take 1 tablet by mouth daily.   ferrous sulfate 325 (65 FE) MG EC tablet Take 325 mg by mouth 3 (three) times daily with meals.  Fluzone High-Dose Quadrivalent 0.7 ML Susy Generic drug: Influenza Vac High-Dose Quad   hydrochlorothiazide 25 MG tablet Commonly known as: HYDRODIURIL Take 1 tablet (25 mg total) by mouth daily.   KRILL OIL PLUS PO Take by mouth 2 (two) times daily.   levothyroxine 100 MCG tablet Commonly known as: SYNTHROID Take 1 tablet (100 mcg total) by mouth daily.   PROBIOTIC DAILY PO Take by mouth daily.   vitamin C 100 MG tablet Take 100 mg by mouth daily.   Vitamin D3 50 MCG (2000 UT) Tabs Take by mouth.   zinc gluconate 50 MG tablet Take 50 mg by mouth  daily.         Objective:   BP (!) 164/77   Pulse 90   Ht 5' (1.524 m)   Wt 138 lb (62.6 kg)   SpO2 98%   BMI 26.95 kg/m   Wt Readings from Last 3 Encounters:  04/23/23 138 lb (62.6 kg)  10/23/22 143 lb (64.9 kg)  06/08/22 143 lb (64.9 kg)    Physical Exam Vitals and nursing note reviewed.  Constitutional:      General: She is not in acute distress.    Appearance: She is well-developed. She is not diaphoretic.  Eyes:     Conjunctiva/sclera: Conjunctivae normal.  Cardiovascular:     Rate and Rhythm: Normal rate and regular rhythm.     Heart sounds: Normal heart sounds. No murmur heard. Pulmonary:     Effort: Pulmonary effort is normal. No respiratory distress.     Breath sounds: Normal breath sounds. No wheezing.  Musculoskeletal:        General: No tenderness. Normal range of motion.  Skin:    General: Skin is warm and dry.     Findings: No rash.  Neurological:     Mental Status: She is alert and oriented to person, place, and time.     Coordination: Coordination normal.  Psychiatric:        Behavior: Behavior normal.       Assessment & Plan:   Problem List Items Addressed This Visit       Cardiovascular and Mediastinum   Primary hypertension   Relevant Orders   CBC with Differential/Platelet   CMP14+EGFR   Lipid panel   TSH     Endocrine   Hypothyroidism - Primary   Relevant Orders   CBC with Differential/Platelet   CMP14+EGFR   Lipid panel   TSH     Other   Dyslipidemia   Relevant Orders   CBC with Differential/Platelet   CMP14+EGFR   Lipid panel   TSH    Blood pressure good at home even though it is high here, continue to monitor at home. Will check blood work.  No change in medication today Follow up plan: Return in about 6 months (around 10/22/2023), or if symptoms worsen or fail to improve, for Thyroid cholesterol hypertension recheck.  Counseling provided for all of the vaccine components Orders Placed This Encounter   Procedures   CBC with Differential/Platelet   CMP14+EGFR   Lipid panel   TSH    Arville Care, MD [facility] 04/23/2023, 8:27 AM

## 2023-04-24 LAB — CMP14+EGFR
ALT: 24 [IU]/L (ref 0–32)
AST: 24 [IU]/L (ref 0–40)
Albumin: 4.4 g/dL (ref 3.8–4.8)
Alkaline Phosphatase: 83 [IU]/L (ref 44–121)
BUN/Creatinine Ratio: 18 (ref 12–28)
BUN: 15 mg/dL (ref 8–27)
Bilirubin Total: 0.6 mg/dL (ref 0.0–1.2)
CO2: 25 mmol/L (ref 20–29)
Calcium: 9.7 mg/dL (ref 8.7–10.3)
Chloride: 94 mmol/L — ABNORMAL LOW (ref 96–106)
Creatinine, Ser: 0.82 mg/dL (ref 0.57–1.00)
Globulin, Total: 3 g/dL (ref 1.5–4.5)
Glucose: 119 mg/dL — ABNORMAL HIGH (ref 70–99)
Potassium: 4.2 mmol/L (ref 3.5–5.2)
Sodium: 135 mmol/L (ref 134–144)
Total Protein: 7.4 g/dL (ref 6.0–8.5)
eGFR: 76 mL/min/{1.73_m2} (ref >59)

## 2023-04-24 LAB — CBC WITH DIFFERENTIAL/PLATELET
Basophils Absolute: 0 10*3/uL (ref 0.0–0.2)
Basos: 0 %
EOS (ABSOLUTE): 0.1 10*3/uL (ref 0.0–0.4)
Eos: 2 %
Hematocrit: 40.8 % (ref 34.0–46.6)
Hemoglobin: 13.9 g/dL (ref 11.1–15.9)
Immature Grans (Abs): 0 10*3/uL (ref 0.0–0.1)
Immature Granulocytes: 1 %
Lymphocytes Absolute: 1.9 10*3/uL (ref 0.7–3.1)
Lymphs: 25 %
MCH: 30.7 pg (ref 26.6–33.0)
MCHC: 34.1 g/dL (ref 31.5–35.7)
MCV: 90 fL (ref 79–97)
Monocytes Absolute: 0.5 10*3/uL (ref 0.1–0.9)
Monocytes: 6 %
Neutrophils Absolute: 4.9 10*3/uL (ref 1.4–7.0)
Neutrophils: 66 %
Platelets: 241 10*3/uL (ref 150–450)
RBC: 4.53 x10E6/uL (ref 3.77–5.28)
RDW: 12.6 % (ref 11.7–15.4)
WBC: 7.4 10*3/uL (ref 3.4–10.8)

## 2023-04-24 LAB — LIPID PANEL
Chol/HDL Ratio: 3.7 {ratio} (ref 0.0–4.4)
Cholesterol, Total: 168 mg/dL (ref 100–199)
HDL: 46 mg/dL (ref >39)
LDL Chol Calc (NIH): 98 mg/dL (ref 0–99)
Triglycerides: 136 mg/dL (ref 0–149)
VLDL Cholesterol Cal: 24 mg/dL (ref 5–40)

## 2023-04-24 LAB — TSH: TSH: 3.02 u[IU]/mL (ref 0.450–4.500)

## 2023-04-27 ENCOUNTER — Telehealth: Payer: Self-pay

## 2023-04-27 NOTE — Telephone Encounter (Signed)
During office visit on 7/18 pt brought in home BP readings.  Readings range from 120's-upper 130's systolic and mid 40'J-WJXBJ 80's diastolic.  Per Dettinger pts BP numbers are good.

## 2023-05-26 ENCOUNTER — Encounter: Payer: Self-pay | Admitting: *Deleted

## 2023-06-10 ENCOUNTER — Ambulatory Visit: Payer: PPO

## 2023-06-10 VITALS — Ht 60.0 in | Wt 138.0 lb

## 2023-06-10 DIAGNOSIS — Z0000 Encounter for general adult medical examination without abnormal findings: Secondary | ICD-10-CM | POA: Diagnosis not present

## 2023-06-10 NOTE — Progress Notes (Signed)
Subjective:   Margaret Stevens is a 72 y.o. female who presents for Medicare Annual (Subsequent) preventive examination.  Visit Complete: Virtual I connected with  Margaret Stevens on 06/10/23 by a audio enabled telemedicine application and verified that I am speaking with the correct person using two identifiers.  Patient Location: Home  Provider Location: Home Office  I discussed the limitations of evaluation and management by telemedicine. The patient expressed understanding and agreed to proceed.  Vital Signs: Because this visit was a virtual/telehealth visit, some criteria may be missing or patient reported. Any vitals not documented were not able to be obtained and vitals that have been documented are patient reported.  Cardiac Risk Factors include: advanced age (>62men, >20 women);hypertension;dyslipidemia     Objective:    Today's Vitals   06/10/23 1434  Weight: 138 lb (62.6 kg)  Height: 5' (1.524 m)   Body mass index is 26.95 kg/m.     06/10/2023    2:41 PM 06/04/2021    2:42 PM  Advanced Directives  Does Patient Have a Medical Advance Directive? No No  Would patient like information on creating a medical advance directive? Yes (MAU/Ambulatory/Procedural Areas - Information given) No - Patient declined    Current Medications (verified) Outpatient Encounter Medications as of 06/10/2023  Medication Sig   Ascorbic Acid (VITAMIN C) 100 MG tablet Take 100 mg by mouth daily.   BINAXNOW COVID-19 AG HOME TEST KIT Use as Directed on the Package   Calcium 200 MG TABS Take 1 tablet by mouth daily.   Cholecalciferol (VITAMIN D3) 2000 units TABS Take by mouth.   docusate sodium (COLACE) 100 MG capsule Take 100 mg by mouth daily.   Elderberry 500 MG CAPS Take 1 tablet by mouth daily.   ferrous sulfate 325 (65 FE) MG EC tablet Take 325 mg by mouth 3 (three) times daily with meals.   Fish Oil-Krill Oil (KRILL OIL PLUS PO) Take by mouth 2 (two) times daily.    FLUZONE  HIGH-DOSE QUADRIVALENT 0.7 ML SUSY    hydrochlorothiazide (HYDRODIURIL) 25 MG tablet Take 1 tablet (25 mg total) by mouth daily.   levothyroxine (SYNTHROID) 100 MCG tablet Take 1 tablet (100 mcg total) by mouth daily.   Probiotic Product (PROBIOTIC DAILY PO) Take by mouth daily.   zinc gluconate 50 MG tablet Take 50 mg by mouth daily.   No facility-administered encounter medications on file as of 06/10/2023.    Allergies (verified) Patient has no known allergies.   History: Past Medical History:  Diagnosis Date   Hypertension 04/21/2021   Thyroid disease    History reviewed. No pertinent surgical history. Family History  Problem Relation Age of Onset   Hypertension Mother    Heart disease Father    Arthritis Father    COPD Father    Learning disabilities Father    Hypertension Father    Diabetes Brother    Learning disabilities Brother    Cancer Maternal Grandmother        skin   Depression Maternal Grandmother    Stroke Maternal Grandmother    Mental illness Maternal Grandmother    Hypertension Maternal Grandmother    Hypertension Maternal Grandfather    Heart disease Paternal Grandmother    Heart disease Paternal Grandfather    Social History   Socioeconomic History   Marital status: Married    Spouse name: Merton Border   Number of children: 1   Years of education: Not on file   Highest education  level: Not on file  Occupational History   Not on file  Tobacco Use   Smoking status: Never   Smokeless tobacco: Never  Vaping Use   Vaping status: Never Used  Substance and Sexual Activity   Alcohol use: No   Drug use: No   Sexual activity: Yes    Birth control/protection: None    Comment: married 43 years  Other Topics Concern   Not on file  Social History Narrative   ** Merged History Encounter **       Married x 48 years in 2022. 1 son 2 granchildren   Social Determinants of Health   Financial Resource Strain: Low Risk  (06/10/2023)   Overall Financial  Resource Strain (CARDIA)    Difficulty of Paying Living Expenses: Not hard at all  Food Insecurity: No Food Insecurity (06/10/2023)   Hunger Vital Sign    Worried About Running Out of Food in the Last Year: Never true    Ran Out of Food in the Last Year: Never true  Transportation Needs: No Transportation Needs (06/10/2023)   PRAPARE - Administrator, Civil Service (Medical): No    Lack of Transportation (Non-Medical): No  Physical Activity: Insufficiently Active (06/10/2023)   Exercise Vital Sign    Days of Exercise per Week: 3 days    Minutes of Exercise per Session: 30 min  Stress: No Stress Concern Present (06/10/2023)   [employer] of Occupational Health - Occupational Stress Questionnaire    Feeling of Stress : Not at all  Social Connections: Moderately Integrated (06/10/2023)   Social Connection and Isolation Panel [NHANES]    Frequency of Communication with Friends and Family: More than three times a week    Frequency of Social Gatherings with Friends and Family: Three times a week    Attends Religious Services: More than 4 times per year    Active Member of Clubs or Organizations: No    Attends Banker Meetings: Never    Marital Status: Married    Tobacco Counseling Counseling given: Not Answered   Clinical Intake:  Pre-visit preparation completed: Yes  Pain : No/denies pain     Diabetes: No  How often do you need to have someone help you when you read instructions, pamphlets, or other written materials from your doctor or pharmacy?: 1 - Never  Interpreter Needed?: No  Information entered by :: Kandis Fantasia LPN   Activities of Daily Living    06/10/2023    2:40 PM  In your present state of health, do you have any difficulty performing the following activities:  Hearing? 0  Vision? 0  Difficulty concentrating or making decisions? 0  Walking or climbing stairs? 0  Dressing or bathing? 0  Doing errands, shopping? 0  Preparing  Food and eating ? N  Using the Toilet? N  In the past six months, have you accidently leaked urine? N  Do you have problems with loss of bowel control? N  Managing your Medications? N  Managing your Finances? N  Housekeeping or managing your Housekeeping? N    Patient Care Team: Dettinger, Elige Radon, MD as PCP - General (Family Medicine)  Indicate any recent Medical Services you may have received from other than [facility] providers in the past year (date may be approximate).     Assessment:   This is a routine wellness examination for [facility].  Hearing/Vision screen Hearing Screening - Comments:: Denies hearing difficulties   Vision Screening - Comments:: No  vision problems; will schedule routine eye exam soon     Goals Addressed             This Visit's Progress    Remain active and independent        Depression Screen    06/10/2023    2:39 PM 04/23/2023    8:14 AM 10/23/2022    8:14 AM 06/08/2022    3:00 PM 04/23/2022    8:08 AM 10/20/2021    8:47 AM 06/04/2021    2:39 PM  PHQ 2/9 Scores  PHQ - 2 Score 0 0 0 0 0 0 0  PHQ- 9 Score 0  0        Fall Risk    06/10/2023    2:40 PM 04/23/2023    8:14 AM 10/23/2022    8:14 AM 06/08/2022    2:59 PM 04/23/2022    8:08 AM  Fall Risk   Falls in the past year? 0 0 0 0 0  Number falls in past yr: 0   0   Injury with Fall? 0   0   Risk for fall due to : No Fall Risks   No Fall Risks   Follow up Falls prevention discussed;Education provided;Falls evaluation completed   Falls prevention discussed     MEDICARE RISK AT HOME: Medicare Risk at Home Any stairs in or around the home?: No If so, are there any without handrails?: No Home free of loose throw rugs in walkways, pet beds, electrical cords, etc?: Yes Adequate lighting in your home to reduce risk of falls?: Yes Life alert?: No Use of a cane, walker or w/c?: No Grab bars in the bathroom?: Yes Shower chair or bench in shower?: No Elevated toilet seat or a handicapped  toilet?: Yes  TIMED UP AND GO:  Was the test performed?  No    Cognitive Function:        06/10/2023    2:41 PM 06/08/2022    3:01 PM 06/04/2021    2:48 PM  6CIT Screen  What Year? 0 points 0 points 0 points  What month? 0 points 0 points 0 points  What time? 0 points 0 points 0 points  Count back from 20 0 points 0 points 0 points  Months in reverse 0 points 0 points 0 points  Repeat phrase 0 points 0 points 0 points  Total Score 0 points 0 points 0 points    Immunizations Immunization History  Administered Date(s) Administered   Fluad Quad(high Dose 65+) 06/20/2020, 04/17/2022   Influenza, High Dose Seasonal PF 05/20/2018, 05/27/2019   Influenza-Unspecified 05/20/2018, 04/29/2021, 05/21/2023   Janssen (J&J) SARS-COV-2 Vaccination 10/16/2019   Moderna Covid-19 Vaccine Bivalent Booster 9yrs & up 05/15/2021   PFIZER(Purple Top)SARS-COV-2 Vaccination 08/09/2020    TDAP status: Due, Education has been provided regarding the importance of this vaccine. Advised may receive this vaccine at local pharmacy or Health Dept. Aware to provide a copy of the vaccination record if obtained from local pharmacy or Health Dept. Verbalized acceptance and understanding.  Flu Vaccine status: Up to date  Pneumococcal vaccine status: Due, Education has been provided regarding the importance of this vaccine. Advised may receive this vaccine at local pharmacy or Health Dept. Aware to provide a copy of the vaccination record if obtained from local pharmacy or Health Dept. Verbalized acceptance and understanding.  Covid-19 vaccine status: Information provided on how to obtain vaccines.   Qualifies for Shingles Vaccine? Yes   Zostavax completed No  Shingrix Completed?: No.    Education has been provided regarding the importance of this vaccine. Patient has been advised to call insurance company to determine out of pocket expense if they have not yet received this vaccine. Advised may also receive  vaccine at local pharmacy or Health Dept. Verbalized acceptance and understanding.  Screening Tests Health Maintenance  Topic Date Due   MAMMOGRAM  Never done   COVID-19 Vaccine (4 - 2023-24 season) 03/07/2023   Zoster Vaccines- Shingrix (1 of 2) 07/24/2023 (Originally 01/10/2001)   Pneumonia Vaccine 4+ Years old (1 of 1 - PCV) 10/23/2023 (Originally 01/11/2016)   Colonoscopy  10/23/2023 (Originally 01/11/1996)   DTaP/Tdap/Td (1 - Tdap) 04/22/2024 (Originally [birth year]10/1970)   Medicare Annual Wellness (AWV)  06/09/2024   INFLUENZA VACCINE  Completed   DEXA SCAN  Completed   Hepatitis C Screening  Completed   HPV VACCINES  Aged Out    Health Maintenance  Health Maintenance Due  Topic Date Due   MAMMOGRAM  Never done   COVID-19 Vaccine (4 - 2023-24 season) 03/07/2023    Colorectal cancer screening:  Patient declines at this time   Mammogram status:  Patient declines at this time   Bone Density status: Completed 10/20/21. Results reflect: Bone density results: OSTEOPENIA. Repeat every 2 years.  Lung Cancer Screening: (Low Dose CT Chest recommended if Age 71-80 years, 20 pack-year currently smoking OR have quit w/in 15years.) does not qualify.   Lung Cancer Screening Referral: n/a  Additional Screening:  Hepatitis C Screening: does qualify; Completed 01/13/16  Vision Screening: Recommended annual ophthalmology exams for early detection of glaucoma and other disorders of the eye. Is the patient up to date with their annual eye exam?  No  Who is the provider or what is the name of the office in which the patient attends annual eye exams? none If pt is not established with a provider, would they like to be referred to a provider to establish care? No .   Dental Screening: Recommended annual dental exams for proper oral hygiene  Community Resource Referral / Chronic Care Management: CRR required this visit?  No   CCM required this visit?  No     Plan:     I have personally  reviewed and noted the following in the patient's chart:   Medical and social history Use of alcohol, tobacco or illicit drugs  Current medications and supplements including opioid prescriptions. Patient is not currently taking opioid prescriptions. Functional ability and status Nutritional status Physical activity Advanced directives List of other physicians Hospitalizations, surgeries, and ER visits in previous 12 months Vitals Screenings to include cognitive, depression, and falls Referrals and appointments  In addition, I have reviewed and discussed with patient certain preventive protocols, quality metrics, and best practice recommendations. A written personalized care plan for preventive services as well as general preventive health recommendations were provided to patient.     Kandis Fantasia [place], [place]   [id number]   After Visit Summary: (MyChart) Due to this being a telephonic visit, the after visit summary with patients personalized plan was offered to patient via MyChart   Nurse Notes: No concerns at this time

## 2023-06-10 NOTE — Patient Instructions (Signed)
Margaret Stevens , Thank you for taking time to come for your Medicare Wellness Visit. I appreciate your ongoing commitment to your health goals. Please review the following plan we discussed and let me know if I can assist you in the future.   Referrals/Orders/Follow-Ups/Clinician Recommendations: Aim for 30 minutes of exercise or brisk walking, 6-8 glasses of water, and 5 servings of fruits and vegetables each day.  This is a list of the screening recommended for you and due dates:  Health Maintenance  Topic Date Due   Mammogram  Never done   COVID-19 Vaccine (4 - 2023-24 season) 03/07/2023   Zoster (Shingles) Vaccine (1 of 2) 07/24/2023*   Pneumonia Vaccine (1 of 1 - PCV) 10/23/2023*   Colon Cancer Screening  10/23/2023*   DTaP/Tdap/Td vaccine (1 - Tdap) 04/22/2024*   Medicare Annual Wellness Visit  06/09/2024   Flu Shot  Completed   DEXA scan (bone density measurement)  Completed   Hepatitis C Screening  Completed   HPV Vaccine  Aged Out  *Topic was postponed. The date shown is not the original due date.    Advanced directives: (ACP Link)Information on Advanced Care Planning can be found at [facility] Advance Health Care Directives Advance Health Care Directives ([email or web address]   Next Medicare Annual Wellness Visit scheduled for next year: Yes

## 2023-06-15 ENCOUNTER — Telehealth: Payer: Self-pay

## 2023-06-15 NOTE — Patient Outreach (Signed)
Successful call to patient on today regarding preventative mammogram screening. Patient declined at this time and will follow up with PCP at later date. Upcoming dates for mobile bus shared with patient.   Baruch Gouty [place]/VBCI  [facility] Assistant-Population Health [phone]

## 2023-10-22 ENCOUNTER — Ambulatory Visit: Payer: PPO | Admitting: Family Medicine

## 2023-10-27 ENCOUNTER — Ambulatory Visit: Payer: PPO | Admitting: Family Medicine

## 2023-10-29 ENCOUNTER — Ambulatory Visit: Payer: PPO | Admitting: Family Medicine

## 2023-10-29 ENCOUNTER — Encounter: Payer: Self-pay | Admitting: Family Medicine

## 2023-10-29 VITALS — BP 144/76 | HR 82 | Ht 60.0 in | Wt 137.0 lb

## 2023-10-29 DIAGNOSIS — E785 Hyperlipidemia, unspecified: Secondary | ICD-10-CM | POA: Diagnosis not present

## 2023-10-29 DIAGNOSIS — E039 Hypothyroidism, unspecified: Secondary | ICD-10-CM

## 2023-10-29 DIAGNOSIS — I10 Essential (primary) hypertension: Secondary | ICD-10-CM | POA: Diagnosis not present

## 2023-10-29 MED ORDER — HYDROCHLOROTHIAZIDE 25 MG PO TABS: 25.0000 mg | ORAL_TABLET | Freq: Every day | ORAL | 3 refills | Status: AC

## 2023-10-29 MED ORDER — LEVOTHYROXINE SODIUM 100 MCG PO TABS: 100.0000 ug | ORAL_TABLET | Freq: Every day | ORAL | 3 refills | Status: AC

## 2023-10-29 NOTE — Progress Notes (Signed)
 BP (!) 144/76   Pulse 82   Ht 5' (1.524 m)   Wt 137 lb (62.1 kg)   SpO2 98%   BMI 26.76 kg/m    Subjective:   Patient ID: Margaret Stevens, female    DOB: [birth year], 73 y.o.   MRN: [id number]  HPI: Margaret Stevens is a 73 y.o. female presenting on 10/29/2023 for Medical Management of Chronic Issues and Hypothyroidism   HPI Hypothyroidism recheck Patient is coming in for thyroid  recheck today as well. They deny any issues with hair changes or heat or cold problems or diarrhea or constipation. They deny any chest pain or palpitations. They are currently on levothyroxine  100 micrograms   Hyperlipidemia Patient is coming in for recheck of his hyperlipidemia. The patient is currently taking Krill oil/fish oil. They deny any issues with myalgias or history of liver damage from it. They deny any focal numbness or weakness or chest pain.   Hypertension Patient is currently on hydrochlorothiazide , and their blood pressure today is 144/76 but she has multiple home readings over the past month where her blood pressure runs between 126 and 138 over 70s. Patient denies any lightheadedness or dizziness. Patient denies headaches, blurred vision, chest pains, shortness of breath, or weakness. Denies any side effects from medication and is content with current medication.   Relevant past medical, surgical, family and social history reviewed and updated as indicated. Interim medical history since our last visit reviewed. Allergies and medications reviewed and updated.  Review of Systems  Constitutional:  Negative for chills and fever.  HENT:  Negative for congestion, ear discharge and ear pain.   Eyes:  Negative for visual disturbance.  Respiratory:  Negative for chest tightness and shortness of breath.   Cardiovascular:  Negative for chest pain and leg swelling.  Genitourinary:  Negative for difficulty urinating and dysuria.  Musculoskeletal:  Negative for back pain and gait problem.  Skin:   Negative for rash.  Neurological:  Negative for dizziness, light-headedness and headaches.  Psychiatric/Behavioral:  Negative for agitation and behavioral problems.   All other systems reviewed and are negative.   Per HPI unless specifically indicated above   Allergies as of 10/29/2023   No Known Allergies      Medication List        Accurate as of October 29, 2023 11:16 AM. If you have any questions, ask your nurse or doctor.          BinaxNOW COVID-19 Ag Home Test Kit Generic drug: COVID-19 At Home Antigen Test Use as Directed on the Package   Calcium 200 MG Tabs Take 1 tablet by mouth daily.   docusate sodium 100 MG capsule Commonly known as: COLACE Take 100 mg by mouth daily.   Elderberry 500 MG Caps Take 1 tablet by mouth daily.   ferrous sulfate 325 (65 FE) MG EC tablet Take 325 mg by mouth 3 (three) times daily with meals.   Fluzone High-Dose Quadrivalent 0.7 ML Susy Generic drug: Influenza Vac High-Dose Quad   hydrochlorothiazide  25 MG tablet Commonly known as: HYDRODIURIL  Take 1 tablet (25 mg total) by mouth daily.   KRILL OIL PLUS PO Take by mouth 2 (two) times daily.   levothyroxine  100 MCG tablet Commonly known as: SYNTHROID  Take 1 tablet (100 mcg total) by mouth daily.   PROBIOTIC DAILY PO Take by mouth daily.   vitamin C 100 MG tablet Take 100 mg by mouth daily.   Vitamin D3 50 MCG (2000 UT) Tabs  Take by mouth.   zinc gluconate 50 MG tablet Take 50 mg by mouth daily.         Objective:   BP (!) 144/76   Pulse 82   Ht 5' (1.524 m)   Wt 137 lb (62.1 kg)   SpO2 98%   BMI 26.76 kg/m   Wt Readings from Last 3 Encounters:  10/29/23 137 lb (62.1 kg)  06/10/23 138 lb (62.6 kg)  04/23/23 138 lb (62.6 kg)    Physical Exam Vitals and nursing note reviewed.  Constitutional:      General: She is not in acute distress.    Appearance: She is well-developed. She is not diaphoretic.  Eyes:     Conjunctiva/sclera: Conjunctivae  normal.  Cardiovascular:     Rate and Rhythm: Normal rate and regular rhythm.     Heart sounds: Normal heart sounds. No murmur heard. Pulmonary:     Effort: Pulmonary effort is normal. No respiratory distress.     Breath sounds: Normal breath sounds. No wheezing.  Musculoskeletal:        General: No tenderness. Normal range of motion.  Skin:    General: Skin is warm and dry.     Findings: No rash.  Neurological:     Mental Status: She is alert and oriented to person, place, and time.     Coordination: Coordination normal.  Psychiatric:        Behavior: Behavior normal.       Assessment & Plan:   Problem List Items Addressed This Visit       Cardiovascular and Mediastinum   Primary hypertension   Relevant Medications   hydrochlorothiazide  (HYDRODIURIL ) 25 MG tablet   Other Relevant Orders   CBC with Differential/Platelet   CMP14+EGFR     Endocrine   Hypothyroidism   Relevant Medications   levothyroxine  (SYNTHROID ) 100 MCG tablet   Other Relevant Orders   CBC with Differential/Platelet   CMP14+EGFR   TSH     Other   Dyslipidemia - Primary   Relevant Orders   CBC with Differential/Platelet   CMP14+EGFR   Lipid panel    Blood pressure and heart rate and everything looks good at home, continue to monitor at home.  Will check blood work today for thyroid  and other labs.  No changes Follow up plan: Return in about 6 months (around 04/29/2024), or if symptoms worsen or fail to improve, for Physical exam and thyroid  and cholesterol and hypertension.  Counseling provided for all of the vaccine components Orders Placed This Encounter  Procedures   CBC with Differential/Platelet   CMP14+EGFR   Lipid panel   TSH    Jolyne Needs, MD [facility] 10/29/2023, 11:16 AM

## 2023-10-30 LAB — CBC WITH DIFFERENTIAL/PLATELET
Basophils Absolute: 0 10*3/uL (ref 0.0–0.2)
Basos: 1 %
EOS (ABSOLUTE): 0.2 10*3/uL (ref 0.0–0.4)
Eos: 2 %
Hematocrit: 40.9 % (ref 34.0–46.6)
Hemoglobin: 13.8 g/dL (ref 11.1–15.9)
Immature Grans (Abs): 0 10*3/uL (ref 0.0–0.1)
Immature Granulocytes: 0 %
Lymphocytes Absolute: 2.8 10*3/uL (ref 0.7–3.1)
Lymphs: 33 %
MCH: 29.7 pg (ref 26.6–33.0)
MCHC: 33.7 g/dL (ref 31.5–35.7)
MCV: 88 fL (ref 79–97)
Monocytes Absolute: 0.6 10*3/uL (ref 0.1–0.9)
Monocytes: 7 %
Neutrophils Absolute: 5.1 10*3/uL (ref 1.4–7.0)
Neutrophils: 57 %
Platelets: 256 10*3/uL (ref 150–450)
RBC: 4.64 x10E6/uL (ref 3.77–5.28)
RDW: 12.5 % (ref 11.7–15.4)
WBC: 8.7 10*3/uL (ref 3.4–10.8)

## 2023-10-30 LAB — CMP14+EGFR
ALT: 18 IU/L (ref 0–32)
AST: 20 IU/L (ref 0–40)
Albumin: 4.4 g/dL (ref 3.8–4.8)
Alkaline Phosphatase: 82 IU/L (ref 44–121)
BUN/Creatinine Ratio: 14 (ref 12–28)
BUN: 12 mg/dL (ref 8–27)
Bilirubin Total: 0.6 mg/dL (ref 0.0–1.2)
CO2: 27 mmol/L (ref 20–29)
Calcium: 8.2 mg/dL — ABNORMAL LOW (ref 8.7–10.3)
Chloride: 92 mmol/L — ABNORMAL LOW (ref 96–106)
Creatinine, Ser: 0.84 mg/dL (ref 0.57–1.00)
Globulin, Total: 3.4 g/dL (ref 1.5–4.5)
Glucose: 97 mg/dL (ref 70–99)
Potassium: 3.8 mmol/L (ref 3.5–5.2)
Sodium: 135 mmol/L (ref 134–144)
Total Protein: 7.8 g/dL (ref 6.0–8.5)
eGFR: 74 mL/min/{1.73_m2} (ref >59)

## 2023-10-30 LAB — LIPID PANEL
Chol/HDL Ratio: 3.3 ratio (ref 0.0–4.4)
Cholesterol, Total: 160 mg/dL (ref 100–199)
HDL: 48 mg/dL (ref >39)
LDL Chol Calc (NIH): 94 mg/dL (ref 0–99)
Triglycerides: 96 mg/dL (ref 0–149)
VLDL Cholesterol Cal: 18 mg/dL (ref 5–40)

## 2023-10-30 LAB — TSH: TSH: 2.07 u[IU]/mL (ref 0.450–4.500)

## 2023-11-05 ENCOUNTER — Encounter: Payer: Self-pay | Admitting: Family Medicine

## 2023-11-08 ENCOUNTER — Telehealth: Payer: Self-pay

## 2023-11-08 NOTE — Telephone Encounter (Signed)
See result encounter

## 2023-11-08 NOTE — Telephone Encounter (Signed)
 Copied from CRM [id number]. Topic: Clinical - Lab/Test Results >> Nov 05, 2023  5:54 PM Leory Rands wrote: Reason for CRM: Patient is calling to report that she received her lab results by way of MyChart

## 2024-03-30 ENCOUNTER — Ambulatory Visit (INDEPENDENT_AMBULATORY_CARE_PROVIDER_SITE_OTHER)

## 2024-03-30 VITALS — BP 144/76 | HR 82 | Ht 61.0 in | Wt 137.0 lb

## 2024-03-30 DIAGNOSIS — Z13820 Encounter for screening for osteoporosis: Secondary | ICD-10-CM

## 2024-03-30 DIAGNOSIS — Z0000 Encounter for general adult medical examination without abnormal findings: Secondary | ICD-10-CM | POA: Diagnosis not present

## 2024-03-30 NOTE — Progress Notes (Signed)
 Subjective:   Margaret Stevens is a 73 y.o. who presents for a Medicare Wellness preventive visit.  As a reminder, Annual Wellness Visits don't include a physical exam, and some assessments may be limited, especially if this visit is performed virtually. We may recommend an in-person follow-up visit with your provider if needed.  Visit Complete: Virtual I connected with  Margaret Stevens on 03/30/24 by a audio enabled telemedicine application and verified that I am speaking with the correct person using two identifiers.  Patient Location: Home  Provider Location: Home Office  I discussed the limitations of evaluation and management by telemedicine. The patient expressed understanding and agreed to proceed.  Vital Signs: Because this visit was a virtual/telehealth visit, some criteria may be missing or patient reported. Any vitals not documented were not able to be obtained and vitals that have been documented are patient reported.  VideoDeclined- This patient declined Librarian, academic. Therefore the visit was completed with audio only.  Persons Participating in Visit: Patient.  AWV Questionnaire: No: Patient Medicare AWV questionnaire was not completed prior to this visit.  Cardiac Risk Factors include: advanced age (>64men, >65 women);dyslipidemia;hypertension     Objective:    Today's Vitals   03/30/24 0804  BP: (!) 144/76  Pulse: 82  Weight: 137 lb (62.1 kg)  Height: 5' 1 (1.549 m)   Body mass index is 25.89 kg/m.     03/30/2024    8:10 AM 06/10/2023    2:41 PM 06/04/2021    2:42 PM  Advanced Directives  Does Patient Have a Medical Advance Directive? No No No  Would patient like information on creating a medical advance directive?  Yes (MAU/Ambulatory/Procedural Areas - Information given) No - Patient declined    Current Medications (verified) Outpatient Encounter Medications as of 03/30/2024  Medication Sig   Ascorbic Acid (VITAMIN  C) 100 MG tablet Take 100 mg by mouth daily.   BINAXNOW COVID-19 AG HOME TEST KIT Use as Directed on the Package   Calcium 200 MG TABS Take 1 tablet by mouth daily.   Cholecalciferol (VITAMIN D3) 2000 units TABS Take by mouth.   docusate sodium (COLACE) 100 MG capsule Take 100 mg by mouth daily.   Elderberry 500 MG CAPS Take 1 tablet by mouth daily.   ferrous sulfate 325 (65 FE) MG EC tablet Take 325 mg by mouth 3 (three) times daily with meals.   Fish Oil-Krill Oil (KRILL OIL PLUS PO) Take by mouth 2 (two) times daily.    FLUZONE HIGH-DOSE QUADRIVALENT 0.7 ML SUSY    hydrochlorothiazide  (HYDRODIURIL ) 25 MG tablet Take 1 tablet (25 mg total) by mouth daily.   levothyroxine  (SYNTHROID ) 100 MCG tablet Take 1 tablet (100 mcg total) by mouth daily.   Probiotic Product (PROBIOTIC DAILY PO) Take by mouth daily.   zinc gluconate 50 MG tablet Take 50 mg by mouth daily.   No facility-administered encounter medications on file as of 03/30/2024.    Allergies (verified) Patient has no known allergies.   History: Past Medical History:  Diagnosis Date   Hypertension 04/21/2021   Thyroid  disease    History reviewed. No pertinent surgical history. Family History  Problem Relation Age of Onset   Hypertension Mother    Heart disease Father    Arthritis Father    COPD Father    Learning disabilities Father    Hypertension Father    Diabetes Brother    Learning disabilities Brother    Cancer Maternal  Grandmother        skin   Depression Maternal Grandmother    Stroke Maternal Grandmother    Mental illness Maternal Grandmother    Hypertension Maternal Grandmother    Hypertension Maternal Grandfather    Heart disease Paternal Grandmother    Heart disease Paternal Grandfather    Social History   Socioeconomic History   Marital status: Married    Spouse name: [place]   Number of children: 1   Years of education: Not on file   Highest education level: 12th grade  Occupational History    Not on file  Tobacco Use   Smoking status: Never   Smokeless tobacco: Never  Vaping Use   Vaping status: Never Used  Substance and Sexual Activity   Alcohol use: No   Drug use: No   Sexual activity: Yes    Birth control/protection: None    Comment: married 43 years  Other Topics Concern   Not on file  Social History Narrative   ** Merged History Encounter **       Married x 48 years in 2022. 1 son 2 granchildren   Social Drivers of Corporate investment banker Strain: Low Risk  (10/28/2023)   Overall Financial Resource Strain (CARDIA)    Difficulty of Paying Living Expenses: Not hard at all  Food Insecurity: No Food Insecurity (03/30/2024)   Hunger Vital Sign    Worried About Running Out of Food in the Last Year: Never true    Ran Out of Food in the Last Year: Never true  Transportation Needs: No Transportation Needs (03/30/2024)   PRAPARE - Administrator, Civil Service (Medical): No    Lack of Transportation (Non-Medical): No  Physical Activity: Insufficiently Active (03/30/2024)   Exercise Vital Sign    Days of Exercise per Week: 4 days    Minutes of Exercise per Session: 30 min  Stress: No Stress Concern Present (03/30/2024)   [employer] of Occupational Health - Occupational Stress Questionnaire    Feeling of Stress: Not at all  Social Connections: Socially Integrated (03/30/2024)   Social Connection and Isolation Panel    Frequency of Communication with Friends and Family: More than three times a week    Frequency of Social Gatherings with Friends and Family: More than three times a week    Attends Religious Services: More than 4 times per year    Active Member of [employer] or Organizations: Yes    Attends Engineer, structural: More than 4 times per year    Marital Status: Married    Tobacco Counseling Counseling given: Yes    Clinical Intake:  Pre-visit preparation completed: Yes  Pain : No/denies pain     BMI - recorded:  25.89 Nutritional Status: BMI 25 -29 Overweight Nutritional Risks: None Diabetes: No  Lab Results  Component Value Date   HGBA1C 5.9 03/24/2017   HGBA1C 5.8 01/15/2016     How often do you need to have someone help you when you read instructions, pamphlets, or other written materials from your doctor or pharmacy?: 1 - Never  Interpreter Needed?: No  Information entered by :: alia t/cma   Activities of Daily Living     03/30/2024    8:07 AM 06/10/2023    2:40 PM  In your present state of health, do you have any difficulty performing the following activities:  Hearing? 0 0  Vision? 0 0  Difficulty concentrating or making decisions? 0 0  Walking  or climbing stairs? 0 0  Dressing or bathing? 0 0  Doing errands, shopping? 0 0  Preparing Food and eating ? N N  Using the Toilet? N N  In the past six months, have you accidently leaked urine? Y N  Do you have problems with loss of bowel control? N N  Managing your Medications? N N  Managing your Finances? N N  Housekeeping or managing your Housekeeping? N N    Patient Care Team: Dettinger, Fonda LABOR, MD as PCP - General (Family Medicine)  I have updated your Care Teams any recent Medical Services you may have received from other providers in the past year.     Assessment:   This is a routine wellness examination for [facility].  Hearing/Vision screen Hearing Screening - Comments:: Pt have some loss of hearing Vision Screening - Comments:: Pt wear glasses for reading/[facility] In [place]/last ov 6/24   Goals Addressed             This Visit's Progress    Remain active and independent   On track      Depression Screen     03/30/2024    8:10 AM 10/29/2023   11:02 AM 06/10/2023    2:39 PM 04/23/2023    8:14 AM 10/23/2022    8:14 AM 06/08/2022    3:00 PM 04/23/2022    8:08 AM  PHQ 2/9 Scores  PHQ - 2 Score 0 0 0 0 0 0 0  PHQ- 9 Score   0  0      Fall Risk     03/30/2024    8:06 AM 10/29/2023   11:02 AM 06/10/2023     2:40 PM 04/23/2023    8:14 AM 10/23/2022    8:14 AM  Fall Risk   Falls in the past year? 0 0 0 0 0  Number falls in past yr: 0 0 0    Injury with Fall? 0 0 0    Risk for fall due to : No Fall Risks No Fall Risks No Fall Risks    Follow up Falls evaluation completed Falls evaluation completed Falls prevention discussed;Education provided;Falls evaluation completed      MEDICARE RISK AT HOME:  Medicare Risk at Home Any stairs in or around the home?: Yes If so, are there any without handrails?: Yes Home free of loose throw rugs in walkways, pet beds, electrical cords, etc?: Yes Adequate lighting in your home to reduce risk of falls?: Yes Life alert?: No Use of a cane, walker or w/c?: No Grab bars in the bathroom?: Yes Shower chair or bench in shower?: Yes Elevated toilet seat or a handicapped toilet?: Yes  TIMED UP AND GO:  Was the test performed?  no  Cognitive Function: 6CIT completed        03/30/2024    8:13 AM 06/10/2023    2:41 PM 06/08/2022    3:01 PM 06/04/2021    2:48 PM  6CIT Screen  What Year? 0 points 0 points 0 points 0 points  What month? 0 points 0 points 0 points 0 points  What time? 0 points 0 points 0 points 0 points  Count back from 20 0 points 0 points 0 points 0 points  Months in reverse 0 points 0 points 0 points 0 points  Repeat phrase 0 points 0 points 0 points 0 points  Total Score 0 points 0 points 0 points 0 points    Immunizations Immunization History  Administered Date(s) Administered  Fluad Quad(high Dose 65+) 06/20/2020, 04/17/2022   INFLUENZA, HIGH DOSE SEASONAL PF 05/20/2018, 05/27/2019   Influenza-Unspecified 05/20/2018, 04/29/2021, 05/21/2023   Janssen (J&J) SARS-COV-2 Vaccination 10/16/2019   Moderna Covid-19 Vaccine Bivalent Booster 79yrs & up 05/15/2021   PFIZER(Purple Top)SARS-COV-2 Vaccination 08/09/2020   Unspecified SARS-COV-2 Vaccination 06/18/2023    Screening Tests Health Maintenance  Topic Date Due   DEXA SCAN   10/21/2023   Influenza Vaccine  02/04/2024   COVID-19 Vaccine (5 - 2024-25 season) 03/06/2024   DTaP/Tdap/Td (1 - Tdap) 04/22/2024 (Originally 01/10/1970)   Pneumococcal Vaccine: 50+ Years (1 of 1 - PCV) 10/28/2024 (Originally 01/10/2001)   Mammogram  10/28/2024 (Originally 01/11/1991)   Colonoscopy  10/28/2024 (Originally 01/11/1996)   Zoster Vaccines- Shingrix (1 of 2) 01/26/2025 (Originally 01/10/2001)   Medicare Annual Wellness (AWV)  03/30/2025   Hepatitis C Screening  Completed   HPV VACCINES  Aged Out   Meningococcal B Vaccine  Aged Out    Health Maintenance Items Addressed: DEXA ordered  Additional Screening:  Vision Screening: Recommended annual ophthalmology exams for early detection of glaucoma and other disorders of the eye. Is the patient up to date with their annual eye exam?  Yes  Who is the provider or what is the name of the office in which the patient attends annual eye exams? [facility]  Dental Screening: Recommended annual dental exams for proper oral hygiene  Community Resource Referral / Chronic Care Management: CRR required this visit?  No   CCM required this visit?  No   Plan:    I have personally reviewed and noted the following in the patient's chart:   Medical and social history Use of alcohol, tobacco or illicit drugs  Current medications and supplements including opioid prescriptions. Patient is not currently taking opioid prescriptions. Functional ability and status Nutritional status Physical activity Advanced directives List of other physicians Hospitalizations, surgeries, and ER visits in previous 12 months Vitals Screenings to include cognitive, depression, and falls Referrals and appointments  In addition, I have reviewed and discussed with patient certain preventive protocols, quality metrics, and best practice recommendations. A written personalized care plan for preventive services as well as general preventive health  recommendations were provided to patient.   Ozie Ned, CMA   03/30/2024   After Visit Summary: (MyChart) Due to this being a telephonic visit, the after visit summary with patients personalized plan was offered to patient via MyChart   Notes: Nothing significant to report at this time.

## 2024-03-30 NOTE — Patient Instructions (Signed)
 Margaret Stevens,  Thank you for taking the time for your Medicare Wellness Visit. I appreciate your continued commitment to your health goals. Please review the care plan we discussed, and feel free to reach out if I can assist you further.  Medicare recommends these wellness visits once per year to help you and your care team stay ahead of potential health issues. These visits are designed to focus on prevention, allowing your provider to concentrate on managing your acute and chronic conditions during your regular appointments.  Please note that Annual Wellness Visits do not include a physical exam. Some assessments may be limited, especially if the visit was conducted virtually. If needed, we may recommend a separate in-person follow-up with your provider.  Ongoing Care Seeing your primary care provider every 3 to 6 months helps us  monitor your health and provide consistent, personalized care.   Referrals If a referral was made during today's visit and you haven't received any updates within two weeks, please contact the referred provider directly to check on the status.  Recommended Screenings:  Health Maintenance  Topic Date Due   DEXA scan (bone density measurement)  10/21/2023   Flu Shot  02/04/2024   COVID-19 Vaccine (5 - 2024-25 season) 03/06/2024   DTaP/Tdap/Td vaccine (1 - Tdap) 04/22/2024*   Pneumococcal Vaccine for age over 39 (1 of 1 - PCV) 10/28/2024*   Breast Cancer Screening  10/28/2024*   Colon Cancer Screening  10/28/2024*   Zoster (Shingles) Vaccine (1 of 2) 01/26/2025*   Medicare Annual Wellness Visit  06/09/2024   Hepatitis C Screening  Completed   HPV Vaccine  Aged Out   Meningitis B Vaccine  Aged Out  *Topic was postponed. The date shown is not the original due date.       03/30/2024    8:10 AM  Advanced Directives  Does Patient Have a Medical Advance Directive? No   Advance Care Planning is important because it: Ensures you receive medical care that aligns  with your values, goals, and preferences. Provides guidance to your family and loved ones, reducing the emotional burden of decision-making during critical moments.  Vision: Annual vision screenings are recommended for early detection of glaucoma, cataracts, and diabetic retinopathy. These exams can also reveal signs of chronic conditions such as diabetes and high blood pressure.  Dental: Annual dental screenings help detect early signs of oral cancer, gum disease, and other conditions linked to overall health, including heart disease and diabetes.  Please see the attached documents for additional preventive care recommendations.

## 2024-04-28 ENCOUNTER — Encounter: Payer: Self-pay | Admitting: Family Medicine

## 2024-04-28 ENCOUNTER — Ambulatory Visit: Admitting: Family Medicine

## 2024-04-28 ENCOUNTER — Ambulatory Visit (INDEPENDENT_AMBULATORY_CARE_PROVIDER_SITE_OTHER)

## 2024-04-28 VITALS — BP 143/75 | HR 76 | Ht 61.0 in | Wt 136.0 lb

## 2024-04-28 DIAGNOSIS — Z13820 Encounter for screening for osteoporosis: Secondary | ICD-10-CM

## 2024-04-28 DIAGNOSIS — E785 Hyperlipidemia, unspecified: Secondary | ICD-10-CM | POA: Diagnosis not present

## 2024-04-28 DIAGNOSIS — Z780 Asymptomatic menopausal state: Secondary | ICD-10-CM

## 2024-04-28 DIAGNOSIS — I10 Essential (primary) hypertension: Secondary | ICD-10-CM | POA: Diagnosis not present

## 2024-04-28 DIAGNOSIS — Z0000 Encounter for general adult medical examination without abnormal findings: Secondary | ICD-10-CM

## 2024-04-28 DIAGNOSIS — Z0001 Encounter for general adult medical examination with abnormal findings: Secondary | ICD-10-CM

## 2024-04-28 DIAGNOSIS — E039 Hypothyroidism, unspecified: Secondary | ICD-10-CM | POA: Diagnosis not present

## 2024-04-28 NOTE — Progress Notes (Signed)
 BP (!) 143/75   Pulse 76   Ht 5' 1 (1.549 m)   Wt 136 lb (61.7 kg)   SpO2 99%   BMI 25.70 kg/m    Subjective:   Patient ID: Margaret Stevens, female    DOB: February 04, 1951, 72 y.o.   MRN: [id number]  HPI: Margaret Stevens is a 73 y.o. female presenting on 04/28/2024 for Medical Management of Chronic Issues, Hypothyroidism, and Hyperlipidemia   Discussed the use of AI scribe software for clinical note transcription with the patient, who gave verbal consent to proceed.  History of Present Illness   Margaret Stevens is a 73 year old female who presents for an annual physical exam.  Hypertension - Blood pressure monitored at home, typically in the 130s with occasional variations - Stable on hydrochlorothiazide  for blood pressure management  Thyroid  function - On thyroid  medication - No significant changes in symptoms; feels 'like usual'  Hyperlipidemia - Cholesterol previously elevated, normal at last check - Uses plant-based cholesterol supplement, two capsules twice daily - Adheres to dietary modifications with occasional indulgences  Left ankle discomfort and edema - Intermittent left ankle discomfort with swelling, subsides by morning - Symptoms worsened during a two-week period in September with prolonged standing at a book sale - Utilizes Emu oil, Voltaren, and compression socks for symptom relief - Cold weather exacerbates discomfort  Allergic rhinitis - Uses Zyrtec for allergy management - Experiences ear pressure during allergy season - No significant ear problems          Relevant past medical, surgical, family and social history reviewed and updated as indicated. Interim medical history since our last visit reviewed. Allergies and medications reviewed and updated.  Review of Systems  Constitutional:  Negative for chills and fever.  HENT:  Negative for congestion, ear discharge and ear pain.   Eyes:  Negative for redness and visual disturbance.   Respiratory:  Negative for chest tightness and shortness of breath.   Cardiovascular:  Positive for leg swelling. Negative for chest pain.  Genitourinary:  Negative for difficulty urinating and dysuria.  Musculoskeletal:  Positive for arthralgias. Negative for back pain and gait problem.  Skin:  Negative for rash.  Neurological:  Negative for light-headedness and headaches.  Psychiatric/Behavioral:  Negative for agitation and behavioral problems.   All other systems reviewed and are negative.   Per HPI unless specifically indicated above   Allergies as of 04/28/2024   No Known Allergies      Medication List        Accurate as of April 28, 2024 10:22 AM. If you have any questions, ask your nurse or doctor.          STOP taking these medications    BinaxNOW COVID-19 Ag Home Test Kit Generic drug: COVID-19 At Home Antigen Test Stopped by: [facility] Yvone Slape   Elderberry 500 MG Caps Stopped by: [facility] Shanell Aden   Fluzone High-Dose Quadrivalent 0.7 ML Susy Generic drug: Influenza Vac High-Dose Quad Stopped by: [facility] Raynesha Tiedt   KRILL OIL PLUS PO Stopped by: [facility] Adalina Dopson       TAKE these medications    Calcium 200 MG Tabs Take 1 tablet by mouth daily.   CHOLESTEROL SUPPORT PO Take 2 capsules by mouth 2 (two) times daily.   docusate sodium 100 MG capsule Commonly known as: COLACE Take 100 mg by mouth daily.   ferrous sulfate 325 (65 FE) MG EC tablet Take 325 mg by mouth 3 (three) times  daily with meals.   hydrochlorothiazide  25 MG tablet Commonly known as: HYDRODIURIL  Take 1 tablet (25 mg total) by mouth daily.   levothyroxine  100 MCG tablet Commonly known as: SYNTHROID  Take 1 tablet (100 mcg total) by mouth daily.   PROBIOTIC DAILY PO Take by mouth daily.   vitamin C 100 MG tablet Take 100 mg by mouth daily.   Vitamin D3 50 MCG (2000 UT) Tabs Take by mouth.   zinc gluconate 50 MG tablet Take 50 mg by mouth daily.          Objective:   BP (!) 143/75   Pulse 76   Ht 5' 1 (1.549 m)   Wt 136 lb (61.7 kg)   SpO2 99%   BMI 25.70 kg/m   Wt Readings from Last 3 Encounters:  04/28/24 136 lb (61.7 kg)  03/30/24 137 lb (62.1 kg)  10/29/23 137 lb (62.1 kg)    Physical Exam Physical Exam   HEENT: Pharynx normal. Ears normal. CHEST: Lungs clear to auscultation bilaterally. CARDIOVASCULAR: Heart regular rate and rhythm, no murmurs. ABDOMEN: Abdomen non-tender. EXTREMITIES: No edema, pulses normal. NEUROLOGICAL: Normal reflexes.         Assessment & Plan:   Problem List Items Addressed This Visit       Cardiovascular and Mediastinum   Primary hypertension   Relevant Orders   CBC with Differential/Platelet   CMP14+EGFR   Lipid panel     Endocrine   Hypothyroidism   Relevant Orders   TSH     Other   Dyslipidemia   Relevant Orders   CBC with Differential/Platelet   CMP14+EGFR   Lipid panel   Other Visit Diagnoses       Physical exam    -  Primary   Relevant Orders   CBC with Differential/Platelet   CMP14+EGFR   Lipid panel     Postmenopausal       Relevant Orders   DG [facility] DEXA           Osteoarthritis Left ankle pain likely due to arthritis, exacerbated by prolonged standing. Compression socks help manage swelling. - Continue topical treatments such as Emu oil and Voltaren. - Recommend wearing compression socks.  Hypertension Blood pressure well-controlled with home readings mostly in the 130s. Current reading is 143/75 mmHg. She is on hydrochlorothiazide  and reports doing well. - Continue hydrochlorothiazide .  Hypothyroidism She is on thyroid  medication and reports feeling as usual. - Order thyroid  function tests.  Hyperlipidemia Cholesterol levels normal at last check. She is taking a plant-based cholesterol supplement and reports dietary efforts. - Order lipid panel.  General Health Maintenance Received flu shot. Tetanus vaccination is due. Discussed  importance of tetanus vaccination. She is aware of need for mammogram due to family history but has concerns. - Advise tetanus vaccination. - Encourage scheduling a mammogram.  Follow-up Routine follow-up planned to monitor health conditions and review lab results. - Schedule follow-up in six months. - Perform blood work including thyroid  function tests and lipid panel.          Follow up plan: Return in about 6 months (around 10/27/2024), or if symptoms worsen or fail to improve, for Hypertension and hypothyroidism hyperlipidemia recheck.  Counseling provided for all of the vaccine components Orders Placed This Encounter  Procedures   DG [facility] DEXA   CBC with Differential/Platelet   CMP14+EGFR   Lipid panel   TSH    Fonda Levins, MD [facility] 04/28/2024, 10:22 AM

## 2024-04-29 LAB — CBC WITH DIFFERENTIAL/PLATELET
Basophils Absolute: 0 x10E3/uL (ref 0.0–0.2)
Basos: 1 %
EOS (ABSOLUTE): 0.1 x10E3/uL (ref 0.0–0.4)
Eos: 2 %
Hematocrit: 41.2 % (ref 34.0–46.6)
Hemoglobin: 13.6 g/dL (ref 11.1–15.9)
Immature Grans (Abs): 0 x10E3/uL (ref 0.0–0.1)
Immature Granulocytes: 0 %
Lymphocytes Absolute: 1.7 x10E3/uL (ref 0.7–3.1)
Lymphs: 27 %
MCH: 30.2 pg (ref 26.6–33.0)
MCHC: 33 g/dL (ref 31.5–35.7)
MCV: 91 fL (ref 79–97)
Monocytes Absolute: 0.5 x10E3/uL (ref 0.1–0.9)
Monocytes: 7 %
Neutrophils Absolute: 4 x10E3/uL (ref 1.4–7.0)
Neutrophils: 63 %
Platelets: 247 x10E3/uL (ref 150–450)
RBC: 4.51 x10E6/uL (ref 3.77–5.28)
RDW: 12.4 % (ref 11.7–15.4)
WBC: 6.4 x10E3/uL (ref 3.4–10.8)

## 2024-04-29 LAB — CMP14+EGFR
ALT: 19 IU/L (ref 0–32)
AST: 22 IU/L (ref 0–40)
Albumin: 4.5 g/dL (ref 3.8–4.8)
Alkaline Phosphatase: 81 IU/L (ref 49–135)
BUN/Creatinine Ratio: 18 (ref 12–28)
BUN: 14 mg/dL (ref 8–27)
Bilirubin Total: 0.6 mg/dL (ref 0.0–1.2)
CO2: 25 mmol/L (ref 20–29)
Calcium: 9.7 mg/dL (ref 8.7–10.3)
Chloride: 95 mmol/L — ABNORMAL LOW (ref 96–106)
Creatinine, Ser: 0.79 mg/dL (ref 0.57–1.00)
Globulin, Total: 3 g/dL (ref 1.5–4.5)
Glucose: 115 mg/dL — ABNORMAL HIGH (ref 70–99)
Potassium: 4 mmol/L (ref 3.5–5.2)
Sodium: 135 mmol/L (ref 134–144)
Total Protein: 7.5 g/dL (ref 6.0–8.5)
eGFR: 79 mL/min/1.73 (ref >59)

## 2024-04-29 LAB — LIPID PANEL
Chol/HDL Ratio: 3.8 ratio (ref 0.0–4.4)
Cholesterol, Total: 183 mg/dL (ref 100–199)
HDL: 48 mg/dL (ref >39)
LDL Chol Calc (NIH): 113 mg/dL — ABNORMAL HIGH (ref 0–99)
Triglycerides: 123 mg/dL (ref 0–149)
VLDL Cholesterol Cal: 22 mg/dL (ref 5–40)

## 2024-04-29 LAB — TSH: TSH: 1 u[IU]/mL (ref 0.450–4.500)

## 2024-05-02 DIAGNOSIS — M810 Age-related osteoporosis without current pathological fracture: Secondary | ICD-10-CM | POA: Diagnosis not present

## 2024-05-02 DIAGNOSIS — Z780 Asymptomatic menopausal state: Secondary | ICD-10-CM | POA: Diagnosis not present

## 2024-05-08 ENCOUNTER — Ambulatory Visit: Payer: Self-pay | Admitting: Family Medicine

## 2024-05-10 ENCOUNTER — Ambulatory Visit: Payer: Self-pay | Admitting: Family Medicine

## 2024-05-10 ENCOUNTER — Telehealth: Payer: Self-pay

## 2024-05-10 NOTE — Telephone Encounter (Signed)
 Copied from CRM [id number]. Topic: Clinical - Lab/Test Results >> May 10, 2024  3:42 PM Emylou G wrote: Reason for CRM: Adv patient of lab results

## 2024-05-10 NOTE — Telephone Encounter (Signed)
 Result encounter updated

## 2024-07-13 NOTE — Progress Notes (Signed)
 Margaret Stevens                                          MRN: [id number]   07/13/2024   The [facility] Quality Team Specialist reviewed this patient medical record for the purposes of chart review for care gap closure. The following were reviewed: chart review for care gap closure-controlling blood pressure.    [facility] Quality Team

## 2024-10-27 ENCOUNTER — Ambulatory Visit: Payer: Self-pay | Admitting: Family Medicine

## 2025-04-02 ENCOUNTER — Ambulatory Visit: Payer: Self-pay
